# Patient Record
Sex: Female | Born: 1990 | Race: White | Hispanic: No | Marital: Single | Smoking: Never smoker
Health system: Southern US, Community
[De-identification: ages and names within clinical notes are randomized; demographics above are authoritative.]

## PROBLEM LIST (undated history)

## (undated) DIAGNOSIS — K219 Gastro-esophageal reflux disease without esophagitis: Secondary | ICD-10-CM

## (undated) DIAGNOSIS — F329 Major depressive disorder, single episode, unspecified: Secondary | ICD-10-CM

## (undated) DIAGNOSIS — F32A Depression, unspecified: Secondary | ICD-10-CM

## (undated) DIAGNOSIS — C801 Malignant (primary) neoplasm, unspecified: Secondary | ICD-10-CM

## (undated) DIAGNOSIS — E039 Hypothyroidism, unspecified: Secondary | ICD-10-CM

## (undated) HISTORY — PX: PILONIDAL CYST DRAINAGE: SHX743

---

## 2015-05-14 ENCOUNTER — Other Ambulatory Visit (HOSPITAL_BASED_OUTPATIENT_CLINIC_OR_DEPARTMENT_OTHER): Payer: Self-pay | Admitting: Physician Assistant

## 2015-05-14 DIAGNOSIS — E049 Nontoxic goiter, unspecified: Secondary | ICD-10-CM

## 2015-05-16 ENCOUNTER — Ambulatory Visit (HOSPITAL_BASED_OUTPATIENT_CLINIC_OR_DEPARTMENT_OTHER)
Admission: RE | Admit: 2015-05-16 | Discharge: 2015-05-16 | Disposition: A | Discharge status: Home / Self Care | Payer: BLUE CROSS/BLUE SHIELD | Source: Ambulatory Visit | Attending: Physician Assistant | Admitting: Physician Assistant

## 2015-05-16 DIAGNOSIS — E049 Nontoxic goiter, unspecified: Secondary | ICD-10-CM | POA: Diagnosis present

## 2015-05-16 DIAGNOSIS — E042 Nontoxic multinodular goiter: Secondary | ICD-10-CM | POA: Diagnosis not present

## 2015-05-29 ENCOUNTER — Other Ambulatory Visit: Payer: Self-pay | Admitting: Physician Assistant

## 2015-05-29 DIAGNOSIS — E041 Nontoxic single thyroid nodule: Secondary | ICD-10-CM

## 2015-06-05 ENCOUNTER — Other Ambulatory Visit (HOSPITAL_COMMUNITY)
Admission: RE | Admit: 2015-06-05 | Discharge: 2015-06-05 | Disposition: A | Discharge status: Home / Self Care | Payer: BLUE CROSS/BLUE SHIELD | Source: Ambulatory Visit | Attending: Interventional Radiology | Admitting: Interventional Radiology

## 2015-06-05 ENCOUNTER — Ambulatory Visit
Admission: RE | Admit: 2015-06-05 | Discharge: 2015-06-05 | Disposition: A | Discharge status: Home / Self Care | Payer: BLUE CROSS/BLUE SHIELD | Source: Ambulatory Visit | Attending: Physician Assistant | Admitting: Physician Assistant

## 2015-06-05 DIAGNOSIS — E041 Nontoxic single thyroid nodule: Secondary | ICD-10-CM | POA: Insufficient documentation

## 2015-09-16 ENCOUNTER — Ambulatory Visit: Payer: Self-pay | Admitting: General Surgery

## 2015-09-16 DIAGNOSIS — C73 Malignant neoplasm of thyroid gland: Secondary | ICD-10-CM | POA: Diagnosis not present

## 2015-09-16 NOTE — H&P (Signed)
History of Present Illness Ralene Ok MD; 09/16/2015 4:11 PM) The patient is a 24 year old female who presents with a thyroid nodule. Patient is a 25 year old female who is referred by Corine Shelter, PA for evaluation of a thyroid nodule. Patient underwent FNA of the left thyroid nodule which measured 2.6 x 2 x 1.9 cm. Patient has had no dysphagia. This resulted in suspicious for malignancy Bethesda category 5. This appeared suspicious for papillary carcinoma. Patient did have a previous ultrasound which revealed: Right thyroid lobe  Measurements: 5.0 cm x 1.5 cm x 1.7 cm. Heterogeneous thyroid tissue on the right without significant increased flow.  Inferior thyroid nodule measures 1.2 cm x 9 mm x 8 mm. No internal calcifications. Hypoechoic margin with no infiltrative border.  Left thyroid lobe  Measurements: 4.9 cm x 2.0 cm x 2.1 cm. Heterogeneous left-sided thyroid tissue with no increased internal flow. Nodule of the left thyroid measures 2.6 cm x 2.0 cm x 1.9 cm. Hypoechoic margin. Internal colloid or calcium.  Isthmus  Thickness: 5 mm. Isthmic nodule measures 9 mm x 7 mm x 6 mm.     Other Problems Marjean Donna, CMA; 09/16/2015 3:38 PM) Anxiety Disorder Depression  Diagnostic Studies History Marjean Donna, CMA; 09/16/2015 3:38 PM) Colonoscopy never Mammogram never Pap Smear never  Allergies Davy Pique Bynum, CMA; 09/16/2015 3:39 PM) No Known Drug Allergies06/20/2017  Medication History Marjean Donna, CMA; 09/16/2015 3:39 PM) Unisom (25MG  Tablet, Oral) Active. Medications Reconciled  Social History Marjean Donna, CMA; 09/16/2015 3:38 PM) Alcohol use Moderate alcohol use. Caffeine use Coffee. No drug use Tobacco use Never smoker.  Family History Marjean Donna, [place]; 09/16/2015 3:38 PM) Alcohol Abuse Mother. Breast Cancer Family Members In General. Depression Mother. Hypertension Mother.  Pregnancy / Birth History Marjean Donna, [place]; 09/16/2015 3:38  PM) Age at menarche 7 years. Gravida 0 Irregular periods Para 0    Review of Systems ([place]; 09/16/2015 3:38 PM) General Present- Fatigue. Not Present- Appetite Loss, Chills, Fever, Night Sweats, Weight Gain and Weight Loss. Skin Not Present- Change in Wart/Mole, Dryness, Hives, Jaundice, New Lesions, Non-Healing Wounds, Rash and Ulcer. HEENT Present- Visual Disturbances. Not Present- Earache, Hearing Loss, Hoarseness, Nose Bleed, Oral Ulcers, Ringing in the Ears, Seasonal Allergies, Sinus Pain, Sore Throat, Wears glasses/contact lenses and Yellow Eyes. Respiratory Not Present- Bloody sputum, Chronic Cough, Difficulty Breathing, Snoring and Wheezing. Breast Not Present- Breast Mass, Breast Pain, Nipple Discharge and Skin Changes. Cardiovascular Not Present- Chest Pain, Difficulty Breathing Lying Down, Leg Cramps, Palpitations, Rapid Heart Rate, Shortness of Breath and Swelling of Extremities. Gastrointestinal Not Present- Abdominal Pain, Bloating, Bloody Stool, Change in Bowel Habits, Chronic diarrhea, Constipation, Difficulty Swallowing, Excessive gas, Gets full quickly at meals, Hemorrhoids, Indigestion, Nausea, Rectal Pain and Vomiting. Female Genitourinary Not Present- Frequency, Nocturia, Painful Urination, Pelvic Pain and Urgency. Musculoskeletal Not Present- Back Pain, Joint Pain, Joint Stiffness, Muscle Pain, Muscle Weakness and Swelling of Extremities. Neurological Not Present- Decreased Memory, Fainting, Headaches, Numbness, Seizures, Tingling, Tremor, Trouble walking and Weakness. Psychiatric Present- Anxiety and Depression. Not Present- Bipolar, Change in Sleep Pattern, Fearful and Frequent crying. Endocrine Not Present- Cold Intolerance, Excessive Hunger, Hair Changes, Heat Intolerance, Hot flashes and New Diabetes. Hematology Not Present- Blood Thinners, Easy Bruising, Excessive bleeding, Gland problems, HIV and Persistent Infections.  Vitals (Sonya Bynum CMA;  09/16/2015 3:39 PM) 09/16/2015 3:38 PM Weight: 194 lb Height: 64in Body Surface Area: 1.93 m Body Mass Index: 33.3 kg/m  Temp.: 70F(Temporal)  Pulse: 75 (Regular)  BP: 124/80 (Sitting,  Left Arm, Standard)       Physical Exam Ralene Ok MD; 09/16/2015 4:12 PM) General Mental Status-Alert. General Appearance-Consistent with stated age. Hydration-Well hydrated. Voice-Normal.  Head and Neck Note: Bilateral palpable nodules, left greater than right   Cardiovascular Cardiovascular examination reveals -normal heart sounds, regular rate and rhythm with no murmurs and normal pedal pulses bilaterally.  Abdomen Inspection Inspection of the abdomen reveals - No Hernias. Skin - Scar - no surgical scars. Palpation/Percussion Palpation and Percussion of the abdomen reveal - Soft, Non Tender, No Rebound tenderness, No Rigidity (guarding) and No hepatosplenomegaly. Auscultation Auscultation of the abdomen reveals - Bowel sounds normal.    Assessment & Plan Ralene Ok MD; 09/16/2015 4:13 PM) PAPILLARY CARCINOMA OF THYROID (C73) Impression: Patient is a 25 year old female with a possible papillary carcinoma on FNA biopsy of the thyroid. Patient does have bilateral nodules. At this time I recommended to her to proceed with total thyroidectomy. I discussed with her the pathology of the biopsy as well as the procedure in detail.  1. Will proceed to the operative for a total thyroidectomy 2. All risks and benefits were discussed with the patient to generally include: infection, bleeding, damage to the recurrent laryngeal nerve, damage to parathyroid glands, and possible need for further surgery. Alternatives were offered and described. All questions were answered and the patient voiced understanding of the procedure and wishes to proceed.

## 2015-09-20 NOTE — Pre-Procedure Instructions (Signed)
Cynthia [name]  09/20/2015     No Pharmacies Listed   Your procedure is scheduled on Wednesday, June 28th   Report to [place] at [facility].             (posted surgery time 2:00 pm - 4:29 pm)   Call this number if you have problems the morning of surgery:  [phone]   Remember:  Do not eat food or drink liquids after midnight Tuesday.  Take these medicines the morning of surgery with A SIP OF WATER :   Do not wear jewelry, make-up or nail polish.  Do not wear lotions, powders, or perfumes.     Do not shave underarms & legs 48 hours prior to surgery.    Do not bring valuables to the hospital.  [facility] is not responsible for any belongings or valuables.  Contacts, dentures or bridgework may not be worn into surgery.  Leave your suitcase in the car.  After surgery it may be brought to your room. For patients admitted to the hospital, discharge time will be determined by your treatment team.   Name and phone number of your driver:      Please read over the following fact sheets that you were given. Pain Booklet and Surgical Site Infection Prevention

## 2015-09-22 ENCOUNTER — Encounter (HOSPITAL_COMMUNITY): Payer: Self-pay

## 2015-09-22 ENCOUNTER — Encounter (HOSPITAL_COMMUNITY)
Admission: RE | Admit: 2015-09-22 | Discharge: 2015-09-22 | Disposition: A | Discharge status: Home / Self Care | Payer: BLUE CROSS/BLUE SHIELD | Source: Ambulatory Visit | Attending: General Surgery | Admitting: General Surgery

## 2015-09-22 DIAGNOSIS — F329 Major depressive disorder, single episode, unspecified: Secondary | ICD-10-CM | POA: Diagnosis not present

## 2015-09-22 DIAGNOSIS — E039 Hypothyroidism, unspecified: Secondary | ICD-10-CM | POA: Diagnosis not present

## 2015-09-22 DIAGNOSIS — F419 Anxiety disorder, unspecified: Secondary | ICD-10-CM | POA: Diagnosis not present

## 2015-09-22 DIAGNOSIS — C73 Malignant neoplasm of thyroid gland: Secondary | ICD-10-CM | POA: Diagnosis not present

## 2015-09-22 DIAGNOSIS — E042 Nontoxic multinodular goiter: Secondary | ICD-10-CM | POA: Diagnosis present

## 2015-09-22 DIAGNOSIS — E063 Autoimmune thyroiditis: Secondary | ICD-10-CM | POA: Diagnosis not present

## 2015-09-22 HISTORY — DX: Major depressive disorder, single episode, unspecified: F32.9

## 2015-09-22 HISTORY — DX: Hypothyroidism, unspecified: E03.9

## 2015-09-22 HISTORY — DX: Malignant (primary) neoplasm, unspecified: C80.1

## 2015-09-22 HISTORY — DX: Gastro-esophageal reflux disease without esophagitis: K21.9

## 2015-09-22 HISTORY — DX: Depression, unspecified: F32.A

## 2015-09-22 LAB — CBC
HCT: 39 % (ref 36.0–46.0)
HEMOGLOBIN: 12.3 g/dL (ref 12.0–15.0)
MCH: 23.7 pg — AB (ref 26.0–34.0)
MCHC: 31.5 g/dL (ref 30.0–36.0)
MCV: 75.1 fL — ABNORMAL LOW (ref 78.0–100.0)
PLATELETS: 164 10*3/uL (ref 150–400)
RBC: 5.19 MIL/uL — AB (ref 3.87–5.11)
RDW: 14.7 % (ref 11.5–15.5)
WBC: 5.5 10*3/uL (ref 4.0–10.5)

## 2015-09-22 LAB — HCG, SERUM, QUALITATIVE: PREG SERUM: NEGATIVE

## 2015-09-22 NOTE — [facility] Notes (Addendum)
PCP  Is Dr. Corine Shelter at [place]  03/2015 Denies  Heart problems, no racing.

## 2015-09-23 MED ORDER — CEFAZOLIN SODIUM-DEXTROSE 2-4 GM/100ML-% IV SOLN: 2.0000 g | INTRAVENOUS | Status: DC

## 2015-09-24 ENCOUNTER — Ambulatory Visit (HOSPITAL_COMMUNITY): Payer: BLUE CROSS/BLUE SHIELD

## 2015-09-24 ENCOUNTER — Encounter (HOSPITAL_COMMUNITY)
Admission: RE | Disposition: A | Discharge status: Home / Self Care | Payer: Self-pay | Source: Ambulatory Visit | Attending: General Surgery

## 2015-09-24 ENCOUNTER — Encounter (HOSPITAL_COMMUNITY): Payer: Self-pay | Admitting: *Deleted

## 2015-09-24 ENCOUNTER — Observation Stay (HOSPITAL_COMMUNITY)
Admission: RE | Admit: 2015-09-24 | Discharge: 2015-09-25 | Disposition: A | Discharge status: Home / Self Care | Payer: BLUE CROSS/BLUE SHIELD | Source: Ambulatory Visit | Attending: General Surgery | Admitting: General Surgery

## 2015-09-24 ENCOUNTER — Ambulatory Visit (HOSPITAL_COMMUNITY): Payer: BLUE CROSS/BLUE SHIELD | Admitting: Certified Registered"

## 2015-09-24 DIAGNOSIS — Z01818 Encounter for other preprocedural examination: Secondary | ICD-10-CM

## 2015-09-24 DIAGNOSIS — Z9889 Other specified postprocedural states: Secondary | ICD-10-CM

## 2015-09-24 DIAGNOSIS — E063 Autoimmune thyroiditis: Secondary | ICD-10-CM | POA: Insufficient documentation

## 2015-09-24 DIAGNOSIS — F419 Anxiety disorder, unspecified: Secondary | ICD-10-CM | POA: Diagnosis not present

## 2015-09-24 DIAGNOSIS — C73 Malignant neoplasm of thyroid gland: Principal | ICD-10-CM | POA: Insufficient documentation

## 2015-09-24 DIAGNOSIS — E890 Postprocedural hypothyroidism: Secondary | ICD-10-CM

## 2015-09-24 DIAGNOSIS — F329 Major depressive disorder, single episode, unspecified: Secondary | ICD-10-CM | POA: Diagnosis not present

## 2015-09-24 DIAGNOSIS — E039 Hypothyroidism, unspecified: Secondary | ICD-10-CM | POA: Insufficient documentation

## 2015-09-24 HISTORY — PX: THYROIDECTOMY: SHX17

## 2015-09-24 SURGERY — THYROIDECTOMY
Anesthesia: General | Site: Neck

## 2015-09-24 MED ORDER — SUGAMMADEX SODIUM 200 MG/2ML IV SOLN
INTRAVENOUS | Status: AC
  Filled 2015-09-24: qty 2

## 2015-09-24 MED ORDER — DEXAMETHASONE SODIUM PHOSPHATE 10 MG/ML IJ SOLN
INTRAMUSCULAR | Status: DC | PRN
  Administered 2015-09-24: 10 mg via INTRAVENOUS

## 2015-09-24 MED ORDER — DOXYLAMINE SUCCINATE (SLEEP) 25 MG PO TABS: 25.0000 mg | ORAL_TABLET | Freq: Every evening | ORAL | Status: DC | PRN

## 2015-09-24 MED ORDER — HYDROMORPHONE HCL 1 MG/ML IJ SOLN
INTRAMUSCULAR | Status: AC
  Filled 2015-09-24: qty 1

## 2015-09-24 MED ORDER — LIDOCAINE 2% (20 MG/ML) 5 ML SYRINGE
INTRAMUSCULAR | Status: AC
  Filled 2015-09-24: qty 5

## 2015-09-24 MED ORDER — PHENYLEPHRINE HCL 10 MG/ML IJ SOLN
INTRAMUSCULAR | Status: DC | PRN
  Administered 2015-09-24: 80 ug via INTRAVENOUS
  Administered 2015-09-24: 40 ug via INTRAVENOUS
  Administered 2015-09-24: 80 ug via INTRAVENOUS

## 2015-09-24 MED ORDER — ONDANSETRON HCL 4 MG/2ML IJ SOLN
INTRAMUSCULAR | Status: AC
  Filled 2015-09-24: qty 2

## 2015-09-24 MED ORDER — MIDAZOLAM HCL 5 MG/5ML IJ SOLN
INTRAMUSCULAR | Status: DC | PRN
  Administered 2015-09-24: 2 mg via INTRAVENOUS

## 2015-09-24 MED ORDER — CHLORHEXIDINE GLUCONATE CLOTH 2 % EX PADS: 6.0000 | MEDICATED_PAD | Freq: Once | CUTANEOUS | Status: DC

## 2015-09-24 MED ORDER — HYDROMORPHONE HCL 1 MG/ML IJ SOLN
0.5000 mg | INTRAMUSCULAR | Status: DC | PRN
  Administered 2015-09-25: 0.5 mg via INTRAVENOUS
  Filled 2015-09-24: qty 1

## 2015-09-24 MED ORDER — FENTANYL CITRATE (PF) 100 MCG/2ML IJ SOLN
INTRAMUSCULAR | Status: DC | PRN
  Administered 2015-09-24: 50 ug via INTRAVENOUS
  Administered 2015-09-24: 100 ug via INTRAVENOUS
  Administered 2015-09-24 (×2): 50 ug via INTRAVENOUS

## 2015-09-24 MED ORDER — ONDANSETRON HCL 4 MG/2ML IJ SOLN
INTRAMUSCULAR | Status: DC | PRN
  Administered 2015-09-24: 4 mg via INTRAVENOUS

## 2015-09-24 MED ORDER — DEXTROSE-NACL 5-0.9 % IV SOLN
INTRAVENOUS | Status: DC
  Administered 2015-09-24 – 2015-09-25 (×2): via INTRAVENOUS

## 2015-09-24 MED ORDER — HYDROMORPHONE HCL 1 MG/ML IJ SOLN
0.5000 mg | INTRAMUSCULAR | Status: DC | PRN
  Administered 2015-09-24 (×2): 0.5 mg via INTRAVENOUS

## 2015-09-24 MED ORDER — CEFAZOLIN SODIUM-DEXTROSE 2-4 GM/100ML-% IV SOLN
INTRAVENOUS | Status: AC
  Administered 2015-09-24: 2 g via INTRAVENOUS
  Filled 2015-09-24: qty 100

## 2015-09-24 MED ORDER — BUPIVACAINE HCL (PF) 0.25 % IJ SOLN
INTRAMUSCULAR | Status: AC
  Filled 2015-09-24: qty 30

## 2015-09-24 MED ORDER — ONDANSETRON 4 MG PO TBDP: 4.0000 mg | ORAL_TABLET | Freq: Four times a day (QID) | ORAL | Status: DC | PRN

## 2015-09-24 MED ORDER — 0.9 % SODIUM CHLORIDE (POUR BTL) OPTIME
TOPICAL | Status: DC | PRN
  Administered 2015-09-24: 1000 mL

## 2015-09-24 MED ORDER — FENTANYL CITRATE (PF) 250 MCG/5ML IJ SOLN
INTRAMUSCULAR | Status: AC
  Filled 2015-09-24: qty 5

## 2015-09-24 MED ORDER — ROCURONIUM BROMIDE 100 MG/10ML IV SOLN
INTRAVENOUS | Status: DC | PRN
  Administered 2015-09-24: 50 mg via INTRAVENOUS

## 2015-09-24 MED ORDER — OXYCODONE HCL 5 MG PO TABS
5.0000 mg | ORAL_TABLET | ORAL | Status: DC | PRN
  Administered 2015-09-24 – 2015-09-25 (×3): 10 mg via ORAL
  Filled 2015-09-24 (×3): qty 2

## 2015-09-24 MED ORDER — MIDAZOLAM HCL 2 MG/2ML IJ SOLN
INTRAMUSCULAR | Status: AC
  Filled 2015-09-24: qty 2

## 2015-09-24 MED ORDER — PHENOL 1.4 % MT LIQD
1.0000 | OROMUCOSAL | Status: DC | PRN
  Administered 2015-09-25: 1 via OROMUCOSAL
  Filled 2015-09-24: qty 177

## 2015-09-24 MED ORDER — PROPOFOL 10 MG/ML IV BOLUS
INTRAVENOUS | Status: DC | PRN
  Administered 2015-09-24: 200 mg via INTRAVENOUS

## 2015-09-24 MED ORDER — PHENYLEPHRINE 40 MCG/ML (10ML) SYRINGE FOR IV PUSH (FOR BLOOD PRESSURE SUPPORT)
PREFILLED_SYRINGE | INTRAVENOUS | Status: AC
  Filled 2015-09-24: qty 10

## 2015-09-24 MED ORDER — ONDANSETRON HCL 4 MG/2ML IJ SOLN: 4.0000 mg | Freq: Four times a day (QID) | INTRAMUSCULAR | Status: DC | PRN

## 2015-09-24 MED ORDER — BUPIVACAINE HCL 0.25 % IJ SOLN
INTRAMUSCULAR | Status: DC | PRN
  Administered 2015-09-24: 10 mL

## 2015-09-24 MED ORDER — SUGAMMADEX SODIUM 500 MG/5ML IV SOLN
INTRAVENOUS | Status: DC | PRN
  Administered 2015-09-24: 200 mg via INTRAVENOUS

## 2015-09-24 MED ORDER — PROPOFOL 10 MG/ML IV BOLUS
INTRAVENOUS | Status: AC
  Filled 2015-09-24: qty 20

## 2015-09-24 MED ORDER — LIDOCAINE HCL (CARDIAC) 20 MG/ML IV SOLN
INTRAVENOUS | Status: DC | PRN
  Administered 2015-09-24: 100 mg via INTRAVENOUS

## 2015-09-24 MED ORDER — LACTATED RINGERS IV SOLN
INTRAVENOUS | Status: DC
  Administered 2015-09-24 (×2): via INTRAVENOUS

## 2015-09-24 MED ORDER — ONDANSETRON HCL 4 MG/2ML IJ SOLN: 4.0000 mg | Freq: Once | INTRAMUSCULAR | Status: DC | PRN

## 2015-09-24 MED ORDER — ROCURONIUM BROMIDE 50 MG/5ML IV SOLN
INTRAVENOUS | Status: AC
  Filled 2015-09-24: qty 1

## 2015-09-24 SURGICAL SUPPLY — 47 items
BLADE SURG 15 STRL LF DISP TIS (BLADE) ×1 IMPLANT
BLADE SURG 15 STRL SS (BLADE) ×1
BLADE SURG ROTATE 9660 (MISCELLANEOUS) IMPLANT
CANISTER SUCTION 2500CC (MISCELLANEOUS) ×2 IMPLANT
CLIP TI MEDIUM 24 (CLIP) ×2 IMPLANT
CLIP TI WIDE RED SMALL 24 (CLIP) ×2 IMPLANT
CONT SPEC 4OZ CLIKSEAL STRL BL (MISCELLANEOUS) IMPLANT
COVER SURGICAL LIGHT HANDLE (MISCELLANEOUS) ×2 IMPLANT
CRADLE DONUT ADULT HEAD (MISCELLANEOUS) ×2 IMPLANT
DERMABOND ADVANCED (GAUZE/BANDAGES/DRESSINGS) ×1
DERMABOND ADVANCED .7 DNX12 (GAUZE/BANDAGES/DRESSINGS) ×1 IMPLANT
DRAPE LAPAROTOMY T 98X78 PEDS (DRAPES) ×2 IMPLANT
DRAPE UTILITY XL STRL (DRAPES) ×4 IMPLANT
ELECT COATED BLADE 2.86 ST (ELECTRODE) ×2 IMPLANT
ELECT REM PT RETURN 9FT ADLT (ELECTROSURGICAL) ×2
ELECTRODE REM PT RTRN 9FT ADLT (ELECTROSURGICAL) ×1 IMPLANT
GAUZE SPONGE 4X4 12PLY STRL (GAUZE/BANDAGES/DRESSINGS) ×2 IMPLANT
GAUZE SPONGE 4X4 16PLY XRAY LF (GAUZE/BANDAGES/DRESSINGS) ×4 IMPLANT
GLOVE BIO SURGEON STRL SZ7.5 (GLOVE) ×2 IMPLANT
GOWN STRL REUS W/ TWL LRG LVL3 (GOWN DISPOSABLE) ×1 IMPLANT
GOWN STRL REUS W/ TWL XL LVL3 (GOWN DISPOSABLE) ×1 IMPLANT
GOWN STRL REUS W/TWL LRG LVL3 (GOWN DISPOSABLE) ×1
GOWN STRL REUS W/TWL XL LVL3 (GOWN DISPOSABLE) ×1
HEMOSTAT SURGICEL 2X4 FIBR (HEMOSTASIS) ×2 IMPLANT
KIT BASIN OR (CUSTOM PROCEDURE TRAY) ×2 IMPLANT
KIT ROOM TURNOVER OR (KITS) ×2 IMPLANT
LIQUID BAND (GAUZE/BANDAGES/DRESSINGS) ×2 IMPLANT
NEEDLE HYPO 25GX1X1/2 BEV (NEEDLE) ×2 IMPLANT
NS IRRIG 1000ML POUR BTL (IV SOLUTION) ×2 IMPLANT
PACK SURGICAL SETUP 50X90 (CUSTOM PROCEDURE TRAY) ×2 IMPLANT
PAD ARMBOARD 7.5X6 YLW CONV (MISCELLANEOUS) ×2 IMPLANT
PENCIL BUTTON HOLSTER BLD 10FT (ELECTRODE) ×2 IMPLANT
SHEARS HARMONIC 9CM CVD (BLADE) ×2 IMPLANT
SPECIMEN JAR MEDIUM (MISCELLANEOUS) ×2 IMPLANT
SPONGE INTESTINAL PEANUT (DISPOSABLE) ×4 IMPLANT
SUT MNCRL AB 4-0 PS2 18 (SUTURE) ×2 IMPLANT
SUT SILK 2 0 (SUTURE) ×1
SUT SILK 2 0 SH (SUTURE) ×2 IMPLANT
SUT SILK 2-0 18XBRD TIE 12 (SUTURE) ×1 IMPLANT
SUT SILK 3 0 (SUTURE) ×1
SUT SILK 3-0 18XBRD TIE 12 (SUTURE) ×1 IMPLANT
SUT VIC AB 3-0 SH 18 (SUTURE) ×4 IMPLANT
SYR BULB 3OZ (MISCELLANEOUS) ×2 IMPLANT
SYR CONTROL 10ML LL (SYRINGE) ×2 IMPLANT
TOWEL OR 17X24 6PK STRL BLUE (TOWEL DISPOSABLE) ×2 IMPLANT
TOWEL OR 17X26 10 PK STRL BLUE (TOWEL DISPOSABLE) ×2 IMPLANT
TUBE CONNECTING 12X1/4 (SUCTIONS) ×2 IMPLANT

## 2015-09-24 NOTE — Op Note (Signed)
09/24/2015  3:48 PM  PATIENT:  Cynthia [name]  25 y.o. female  PRE-OPERATIVE DIAGNOSIS:  multiple thyroid nodule, FNAB c/w papillary cancer  POST-OPERATIVE DIAGNOSIS:  same  PROCEDURE:  Procedure(s): TOTAL THYROIDECTOMY (N/A)  SURGEON:  Surgeon(s) and Role:    * Ralene Ok, MD - Primary    * Fanny Skates, MD - Assisting  ANESTHESIA:   local and general  EBL: 15cc  Total I/O In: 1000 [I.V.:1000] Out: -   BLOOD ADMINISTERED:none  DRAINS: none   LOCAL MEDICATIONS USED:  BUPIVICAINE   SPECIMEN:  Source of Specimen:  Total thyroid, stitch in right superior lobe  DISPOSITION OF SPECIMEN:  PATHOLOGY  COUNTS:  YES  TOURNIQUET:  * No tourniquets in log *  DICTATION: .Dragon Dictation  Complications: none   Counts: reported as correct x 2   Findings: fibrotic left thyroid lobe  Indications for procedure: The patient is a 25 y/o F who had a multiple thyroid nodules and FNAB Bethesda V category, possible papillary cancer.    Details of the procedure:  The patient was taken back to the operating room. The patient was placed in supine position with bilateral SCDs in place.  The patient was prepped and draped in the usual sterile fashion. After appropriate anitbiotics were confirmed, a time-out was confirmed and all facts were verified.   A 5 cm incision was made approximately 2 fingerbreadths above the sternal notch. Bovie cautery was used to maintain hemostasis dissection was carried down through the platysma. The platysma was elevated and flaps were created superiorly and inferiorly to the thyroid cartilage as well as the sternal notch, repsectively. The strap muscles were identified in the midline and separated. Left-sided strap muscles were elevated off the anterior surface of the thyroid. This dissection was carried laterally. The middle thyroid vein was identified and doubly ligated. We proceeded to dissect away the superior lobe and Kitners were used to gently  dissect the surrounding musculature from the thyroid. The superior thyroid vessels were identified and doubly ligated with clips and transected with a Harmonic scalpel. At this time this freed up the superior lobe was able to deliver this into the wound. We also identified the superior parathyroid gland which we preserved.   We continued to dissect the thyroid off of the trachea from lateral to medial direction. The Left recurrent laryngeal nerve was identified and protected.  There was a portion of the thyroid gland that was very firm and overlying the nerve.  A portion of the thyoid tissue was cut with #15 blade and left in place.  This was the are that was the most fibrotic and firm.  The inferior thyroid vessels were identified ligated with clips. At this rtime Berry's ligament was dissected away from the trachea. This delivered the left lobe of the thyroid into the wound.    The exact same dissection took place on the right side.  The superior vessels were identified on the right side these were doubly ligated and transected with a harmonic scalpel. This delivered the superior lobe into the wound. The middle and inferior thyroid vessels were also individually doubly ligated and transected using a harmonic scalpel.  This allowed [place] to roll the right thyroid lobe into the wound. The right recurrent laryngeal nerve was not identified our dissection took place on the capsule of the thyroid. The right side of the thyroid was less fibrotic of the left. The dissection was carried medially until basal ligament was encountered. This was  Incised  using cautery. At this time this freed up the entire thyroid. A stitch was placed in the left superior lobe. This was sent off to pathology.   At this time The area was irrigated out with sterile saline.There was an area of the raw portion of the left thyroid tissue that was Left near the left recurrent laryngeal Nerve. With fibrillar and pressure the area became  hemostatic.At this time further sheets of fibrillar were then placed in the left and right peritracheal sides.  Strap muscles were then reapproximated in the midline with interrupted 3-0 Vicryl stitches. The platysma was reapproximated using 3-0 Vicryl stitches in interrupted fashion. Skin was then reapproximated using a running subcuticular 4-0 Monocryl. The skin was then dressed with Dermabond. The patient was taken to the recovery room in stable condition.   PLAN OF CARE: Admit for overnight observation  PATIENT DISPOSITION:  PACU - hemodynamically stable.   Delay start of Pharmacological VTE agent (>24hrs) due to surgical blood loss or risk of bleeding: not applicable

## 2015-09-24 NOTE — Anesthesia Preprocedure Evaluation (Signed)
Anesthesia Evaluation  Patient identified by MRN, date of birth, ID band Patient awake    Reviewed: Allergy & Precautions, NPO status , reviewed documented beta blocker date and time   Airway Mallampati: I       Dental   Pulmonary    Pulmonary exam normal        Cardiovascular Normal cardiovascular exam     Neuro/Psych    GI/Hepatic   Endo/Other  Hypothyroidism   Renal/GU      Musculoskeletal   Abdominal   Peds  Hematology   Anesthesia Other Findings   Reproductive/Obstetrics                             Anesthesia Physical Anesthesia Plan  ASA: I  Anesthesia Plan: General   Post-op Pain Management:    Induction: Intravenous  Airway Management Planned: Oral ETT  Additional Equipment:   Intra-op Plan:   Post-operative Plan: Extubation in OR  Informed Consent: I have reviewed the patients History and Physical, chart, labs and discussed the procedure including the risks, benefits and alternatives for the proposed anesthesia with the patient or authorized representative who has indicated his/her understanding and acceptance.     Plan Discussed with: CRNA, Anesthesiologist and Surgeon  Anesthesia Plan Comments:         Anesthesia Quick Evaluation

## 2015-09-24 NOTE — Transfer of Care (Signed)
Immediate Anesthesia Transfer of Care Note  Patient: Cynthia [name]  Procedure(s) Performed: Procedure(s): TOTAL THYROIDECTOMY (N/A)  Patient Location: PACU  Anesthesia Type:General  Level of Consciousness: awake, alert , oriented and patient cooperative  Airway & Oxygen Therapy: Patient Spontanous Breathing and Patient connected to nasal cannula oxygen  Post-op Assessment: Report given to RN, Post -op Vital signs reviewed and stable and Patient moving all extremities  Post vital signs: Reviewed and stable  Last Vitals:  Filed Vitals:   09/24/15 1219  BP: 137/83  Pulse: 85  Temp: 36.5 C  Resp: 20    Last Pain: There were no vitals filed for this visit.    Patients Stated Pain Goal: 3 ([id number])  Complications: No apparent anesthesia complications

## 2015-09-24 NOTE — H&P (View-Only) (Signed)
History of Present Illness Cynthia Ok MD; 09/16/2015 4:11 PM) The patient is a 25 year old female who presents with a thyroid nodule. Patient is a 25 year old female who is referred by Corine Shelter, PA for evaluation of a thyroid nodule. Patient underwent FNA of the left thyroid nodule which measured 2.6 x 2 x 1.9 cm. Patient has had no dysphagia. This resulted in suspicious for malignancy Bethesda category 5. This appeared suspicious for papillary carcinoma. Patient did have a previous ultrasound which revealed: Right thyroid lobe  Measurements: 5.0 cm x 1.5 cm x 1.7 cm. Heterogeneous thyroid tissue on the right without significant increased flow.  Inferior thyroid nodule measures 1.2 cm x 9 mm x 8 mm. No internal calcifications. Hypoechoic margin with no infiltrative border.  Left thyroid lobe  Measurements: 4.9 cm x 2.0 cm x 2.1 cm. Heterogeneous left-sided thyroid tissue with no increased internal flow. Nodule of the left thyroid measures 2.6 cm x 2.0 cm x 1.9 cm. Hypoechoic margin. Internal colloid or calcium.  Isthmus  Thickness: 5 mm. Isthmic nodule measures 9 mm x 7 mm x 6 mm.     Other Problems Marjean Donna, CMA; 09/16/2015 3:38 PM) Anxiety Disorder Depression  Diagnostic Studies History Marjean Donna, CMA; 09/16/2015 3:38 PM) Colonoscopy never Mammogram never Pap Smear never  Allergies Davy Pique Bynum, CMA; 09/16/2015 3:39 PM) No Known Drug Allergies06/20/2017  Medication History Davy Pique Bynum, CMA; 09/16/2015 3:39 PM) Unisom (25MG  Tablet, Oral) Active. Medications Reconciled  Social History Marjean Donna, CMA; 09/16/2015 3:38 PM) Alcohol use Moderate alcohol use. Caffeine use Coffee. No drug use Tobacco use Never smoker.  Family History Marjean Donna, [place]; 09/16/2015 3:38 PM) Alcohol Abuse Mother. Breast Cancer Family Members In General. Depression Mother. Hypertension Mother.  Pregnancy / Birth History Marjean Donna, [place]; 09/16/2015 3:38  PM) Age at menarche 51 years. Gravida 0 Irregular periods Para 0    Review of Systems ([place]; 09/16/2015 3:38 PM) General Present- Fatigue. Not Present- Appetite Loss, Chills, Fever, Night Sweats, Weight Gain and Weight Loss. Skin Not Present- Change in Wart/Mole, Dryness, Hives, Jaundice, New Lesions, Non-Healing Wounds, Rash and Ulcer. HEENT Present- Visual Disturbances. Not Present- Earache, Hearing Loss, Hoarseness, Nose Bleed, Oral Ulcers, Ringing in the Ears, Seasonal Allergies, Sinus Pain, Sore Throat, Wears glasses/contact lenses and Yellow Eyes. Respiratory Not Present- Bloody sputum, Chronic Cough, Difficulty Breathing, Snoring and Wheezing. Breast Not Present- Breast Mass, Breast Pain, Nipple Discharge and Skin Changes. Cardiovascular Not Present- Chest Pain, Difficulty Breathing Lying Down, Leg Cramps, Palpitations, Rapid Heart Rate, Shortness of Breath and Swelling of Extremities. Gastrointestinal Not Present- Abdominal Pain, Bloating, Bloody Stool, Change in Bowel Habits, Chronic diarrhea, Constipation, Difficulty Swallowing, Excessive gas, Gets full quickly at meals, Hemorrhoids, Indigestion, Nausea, Rectal Pain and Vomiting. Female Genitourinary Not Present- Frequency, Nocturia, Painful Urination, Pelvic Pain and Urgency. Musculoskeletal Not Present- Back Pain, Joint Pain, Joint Stiffness, Muscle Pain, Muscle Weakness and Swelling of Extremities. Neurological Not Present- Decreased Memory, Fainting, Headaches, Numbness, Seizures, Tingling, Tremor, Trouble walking and Weakness. Psychiatric Present- Anxiety and Depression. Not Present- Bipolar, Change in Sleep Pattern, Fearful and Frequent crying. Endocrine Not Present- Cold Intolerance, Excessive Hunger, Hair Changes, Heat Intolerance, Hot flashes and New Diabetes. Hematology Not Present- Blood Thinners, Easy Bruising, Excessive bleeding, Gland problems, HIV and Persistent Infections.  Vitals (Sonya Bynum CMA;  09/16/2015 3:39 PM) 09/16/2015 3:38 PM Weight: 194 lb Height: 64in Body Surface Area: 1.93 m Body Mass Index: 33.3 kg/m  Temp.: 41F(Temporal)  Pulse: 75 (Regular)  BP: 124/80 (Sitting,  Left Arm, Standard)       Physical Exam Cynthia Ok MD; 09/16/2015 4:12 PM) General Mental Status-Alert. General Appearance-Consistent with stated age. Hydration-Well hydrated. Voice-Normal.  Head and Neck Note: Bilateral palpable nodules, left greater than right   Cardiovascular Cardiovascular examination reveals -normal heart sounds, regular rate and rhythm with no murmurs and normal pedal pulses bilaterally.  Abdomen Inspection Inspection of the abdomen reveals - No Hernias. Skin - Scar - no surgical scars. Palpation/Percussion Palpation and Percussion of the abdomen reveal - Soft, Non Tender, No Rebound tenderness, No Rigidity (guarding) and No hepatosplenomegaly. Auscultation Auscultation of the abdomen reveals - Bowel sounds normal.    Assessment & Plan Cynthia Ok MD; 09/16/2015 4:13 PM) PAPILLARY CARCINOMA OF THYROID (C73) Impression: Patient is a 25 year old female with a possible papillary carcinoma on FNA biopsy of the thyroid. Patient does have bilateral nodules. At this time I recommended to her to proceed with total thyroidectomy. I discussed with her the pathology of the biopsy as well as the procedure in detail.  1. Will proceed to the operative for a total thyroidectomy 2. All risks and benefits were discussed with the patient to generally include: infection, bleeding, damage to the recurrent laryngeal nerve, damage to parathyroid glands, and possible need for further surgery. Alternatives were offered and described. All questions were answered and the patient voiced understanding of the procedure and wishes to proceed.

## 2015-09-24 NOTE — Discharge Instructions (Signed)
Thyroidectomy, Care After °Refer to this sheet in the next few weeks. These instructions provide you with information about caring for yourself after your procedure. Your health care provider may also give you more specific instructions. Your treatment has been planned according to current medical practices, but problems sometimes occur. Call your health care provider if you have any problems or questions after your procedure. °WHAT TO EXPECT AFTER THE PROCEDURE °After your procedure, it is typical to have: °· Mild pain in the neck or upper body, especially when swallowing. °· A sore throat. °· A weak voice. °HOME CARE INSTRUCTIONS  °· Take medicines only as directed by your health care provider. °· If your entire thyroid gland was removed, you may need to take thyroid hormone medicine from now on. °· Do not take medicines that contain aspirin and ibuprofen until your health care provider says that you can. These medicines can increase your risk of bleeding. °· Some pain medicines cause constipation. Drink enough fluid to keep your urine clear or pale yellow. This can help to prevent constipation. °· Start slowly with eating. You may need to have only liquids and soft foods for a few days or as directed by your health care provider. °· Do not take baths, swim, or use a hot tub until your health care provider approves. °· There are many different ways to close and cover an incision, including stitches (sutures), skin glue, and adhesive strips. Follow your health care provider's instructions for: °¨ Incision care. °¨ Bandage (dressing) changes and removal. °¨ Incision closure removal. °· Resume your usual activities as directed by your health care provider. °· For the first 10 days after the procedure or as instructed by your health care provider: °¨ Do not lift anything heavier than 20 lb (9.1 kg). °¨ Do not jog, swim, or do other strenuous exercises. °¨ Do not play contact sports. °· Keep all follow-up visits as  directed by your health care provider. This is important. °SEEK MEDICAL CARE IF: °· The soreness in your throat gets worse. °· You have increased pain at your incision or incisions. °· You have increased bleeding from an incision. °· Your incision becomes infected. Watch for: °¨ Swelling. °¨ Redness. °¨ Warmth. °¨ Pus. °· You notice a bad smell coming from an incision or dressing. °· You have a fever. °· You feel lightheaded or faint. °· You have numbness, tingling, or muscle spasms in your: °¨ Arms. °¨ Hands. °¨ Feet. °¨ Face. °· You have trouble swallowing. °SEEK IMMEDIATE MEDICAL CARE IF:  °· You develop a rash. °· You have difficulty breathing. °· You hear whistling noises coming from your chest. °· You develop a cough that gets worse. °· Your speech changes, or you have hoarseness that gets worse. °  °This information is not intended to replace advice given to you by your health care provider. Make sure you discuss any questions you have with your health care provider. °  °Document Released: 10/02/2004 Document Revised: 04/05/2014 Document Reviewed: 08/15/2013 °Elsevier Interactive Patient Education ©2016 [employer] ° °

## 2015-09-24 NOTE — Anesthesia Procedure Notes (Addendum)
Procedure Name: Intubation Date/Time: 09/24/2015 2:29 PM Performed by: Freddie Breech Pre-anesthesia Checklist: Patient identified, Emergency Drugs available, Suction available, Patient being monitored and Timeout performed Patient Re-evaluated:Patient Re-evaluated prior to inductionOxygen Delivery Method: Circle system utilized Preoxygenation: Pre-oxygenation with 100% oxygen Intubation Type: IV induction Ventilation: Mask ventilation without difficulty and Oral airway inserted - appropriate to patient size Laryngoscope Size: Mac and 3 Grade View: Grade I Tube size: 7.0 mm Number of attempts: 1 Airway Equipment and Method: Patient positioned with wedge pillow and Stylet Placement Confirmation: ETT inserted through vocal cords under direct vision,  positive ETCO2,  CO2 detector and breath sounds checked- equal and bilateral Secured at: 22 cm Tube secured with: Tape Dental Injury: Teeth and Oropharynx as per pre-operative assessment

## 2015-09-24 NOTE — Interval H&P Note (Signed)
History and Physical Interval Note:  09/24/2015 10:45 AM  Cynthia [name]  has presented today for surgery, with the diagnosis of papillary cancer of thyroid  The various methods of treatment have been discussed with the patient and family. After consideration of risks, benefits and other options for treatment, the patient has consented to  Procedure(s): TOTAL THYROIDECTOMY (N/A) as a surgical intervention .  The patient's history has been reviewed, patient examined, no change in status, stable for surgery.  I have reviewed the patient's chart and labs.  Questions were answered to the patient's satisfaction.     Rosario Jacks., Anne Hahn

## 2015-09-25 ENCOUNTER — Encounter (HOSPITAL_COMMUNITY): Payer: Self-pay | Admitting: General Surgery

## 2015-09-25 DIAGNOSIS — E063 Autoimmune thyroiditis: Secondary | ICD-10-CM | POA: Diagnosis not present

## 2015-09-25 DIAGNOSIS — F419 Anxiety disorder, unspecified: Secondary | ICD-10-CM | POA: Diagnosis not present

## 2015-09-25 DIAGNOSIS — C73 Malignant neoplasm of thyroid gland: Secondary | ICD-10-CM | POA: Diagnosis not present

## 2015-09-25 DIAGNOSIS — F329 Major depressive disorder, single episode, unspecified: Secondary | ICD-10-CM | POA: Diagnosis not present

## 2015-09-25 LAB — COMPREHENSIVE METABOLIC PANEL
ALT: 189 U/L — AB (ref 14–54)
ANION GAP: 10 (ref 5–15)
AST: 96 U/L — ABNORMAL HIGH (ref 15–41)
Albumin: 3.5 g/dL (ref 3.5–5.0)
Alkaline Phosphatase: 70 U/L (ref 38–126)
BUN: 10 mg/dL (ref 6–20)
CHLORIDE: 98 mmol/L — AB (ref 101–111)
CO2: 25 mmol/L (ref 22–32)
CREATININE: 0.83 mg/dL (ref 0.44–1.00)
Calcium: 8.9 mg/dL (ref 8.9–10.3)
GFR calc non Af Amer: >60 mL/min (ref >60)
Glucose, Bld: 325 mg/dL — ABNORMAL HIGH (ref 65–99)
Potassium: 4.2 mmol/L (ref 3.5–5.1)
SODIUM: 133 mmol/L — AB (ref 135–145)
Total Bilirubin: 0.9 mg/dL (ref 0.3–1.2)
Total Protein: 7.1 g/dL (ref 6.5–8.1)

## 2015-09-25 MED ORDER — OXYCODONE HCL 5 MG PO TABS: 5.0000 mg | ORAL_TABLET | ORAL | Status: DC | PRN

## 2015-09-25 NOTE — Progress Notes (Signed)
Discharge instructions reviewed with pt and prescription given.  Pt verbalized understanding and had no questions.  Pt discharged in stable condition via wheelchair with sister.  Eliezer [place]

## 2015-09-25 NOTE — Discharge Summary (Signed)
Physician Discharge Summary  Patient ID: Cynthia [name] MRN: [id number] DOB/AGE: 27/02/1991 27 y.o.  Admit date: 09/24/2015 Discharge date: 09/25/2015  Admission Diagnoses: s/p total thyroidectomy  Discharge Diagnoses:  Active Problems:   S/P total thyroidectomy   Discharged Condition: good  Hospital Course: Pt was admitted post op for OBS.  She did well overnight.  She had good pain control.  She was tolerating liquids well.  She was otherwise ambulating well.  She was afebrile while in the hospital and dee,ed stabled for DC and Dc'd home.  Consults: None  Significant Diagnostic Studies: labs: Ca normal POD 1  Treatments: surgery: as above  Discharge Exam: Blood pressure 106/58, pulse 72, temperature 97.7 F (36.5 C), temperature source Oral, resp. rate 18, weight 91 kg (200 lb 9.9 oz), last menstrual period 06/24/2015, SpO2 97 %. General appearance: alert and cooperative incision c/d/i  Disposition: Final discharge disposition not confirmed  Discharge Instructions    Diet - low sodium heart healthy    Complete by:  As directed      Increase activity slowly    Complete by:  As directed             Medication List    TAKE these medications        doxylamine (Sleep) 25 MG tablet  Commonly known as:  UNISOM  Take 25-50 mg by mouth at bedtime as needed for sleep.     oxyCODONE 5 MG immediate release tablet  Commonly known as:  Oxy IR/ROXICODONE  Take 1-2 tablets (5-10 mg total) by mouth every 4 (four) hours as needed for moderate pain.           Follow-up Information    Follow up with Reyes Ivan, MD. Schedule an appointment as soon as possible for a visit in 2 weeks.   Specialty:  General Surgery   Why:  For wound re-check   Contact information:   [address] [place] [address] [phone]       Signed: Rosario Jacks., Anne Hahn 09/25/2015, 7:24 AM

## 2015-09-29 ENCOUNTER — Telehealth: Payer: Self-pay | Admitting: General Surgery

## 2015-09-29 NOTE — Telephone Encounter (Signed)
I spoke with Cynthia [name] and notified her of her Path results.  We will be planning to send her to Endocrine for further assessment and likely RAI.

## 2015-10-01 NOTE — Anesthesia Postprocedure Evaluation (Signed)
Anesthesia Post Note  Patient: Cynthia [name]  Procedure(s) Performed: Procedure(s) (LRB): TOTAL THYROIDECTOMY (N/A)  Patient location during evaluation: PACU Anesthesia Type: General Level of consciousness: awake and alert Pain management: pain level controlled Vital Signs Assessment: post-procedure vital signs reviewed and stable Respiratory status: spontaneous breathing, nonlabored ventilation, respiratory function stable and patient connected to nasal cannula oxygen Cardiovascular status: blood pressure returned to baseline and stable Postop Assessment: no signs of nausea or vomiting Anesthetic complications: no     Last Vitals:  Filed Vitals:   09/25/15 0221 09/25/15 0539  BP: 118/71 106/58  Pulse: 83 72  Temp: 36.6 C 36.5 C  Resp: 19 18    Last Pain:  Filed Vitals:   09/25/15 1144  PainSc: 2    Pain Goal: Patients Stated Pain Goal: 3 (09/24/15 1238)               Catalina Gravel

## 2015-10-21 DIAGNOSIS — E063 Autoimmune thyroiditis: Secondary | ICD-10-CM | POA: Diagnosis not present

## 2015-10-21 DIAGNOSIS — R748 Abnormal levels of other serum enzymes: Secondary | ICD-10-CM | POA: Diagnosis not present

## 2015-10-21 DIAGNOSIS — R739 Hyperglycemia, unspecified: Secondary | ICD-10-CM | POA: Diagnosis not present

## 2015-10-21 DIAGNOSIS — C73 Malignant neoplasm of thyroid gland: Secondary | ICD-10-CM | POA: Diagnosis not present

## 2015-10-21 DIAGNOSIS — E1165 Type 2 diabetes mellitus with hyperglycemia: Secondary | ICD-10-CM | POA: Diagnosis not present

## 2015-10-21 DIAGNOSIS — E890 Postprocedural hypothyroidism: Secondary | ICD-10-CM | POA: Diagnosis not present

## 2015-10-24 ENCOUNTER — Other Ambulatory Visit: Payer: Self-pay | Admitting: Internal Medicine

## 2015-10-24 ENCOUNTER — Other Ambulatory Visit (HOSPITAL_COMMUNITY): Payer: Self-pay | Admitting: Internal Medicine

## 2015-10-24 DIAGNOSIS — R945 Abnormal results of liver function studies: Principal | ICD-10-CM

## 2015-10-24 DIAGNOSIS — C73 Malignant neoplasm of thyroid gland: Secondary | ICD-10-CM

## 2015-10-24 DIAGNOSIS — R7989 Other specified abnormal findings of blood chemistry: Secondary | ICD-10-CM

## 2015-10-28 DIAGNOSIS — E890 Postprocedural hypothyroidism: Secondary | ICD-10-CM | POA: Diagnosis not present

## 2015-10-28 DIAGNOSIS — E1165 Type 2 diabetes mellitus with hyperglycemia: Secondary | ICD-10-CM | POA: Diagnosis not present

## 2015-10-28 DIAGNOSIS — C73 Malignant neoplasm of thyroid gland: Secondary | ICD-10-CM | POA: Diagnosis not present

## 2015-10-29 ENCOUNTER — Ambulatory Visit
Admission: RE | Admit: 2015-10-29 | Discharge: 2015-10-29 | Disposition: A | Discharge status: Home / Self Care | Payer: BLUE CROSS/BLUE SHIELD | Source: Ambulatory Visit | Attending: Internal Medicine | Admitting: Internal Medicine

## 2015-10-29 DIAGNOSIS — R945 Abnormal results of liver function studies: Principal | ICD-10-CM

## 2015-10-29 DIAGNOSIS — E1165 Type 2 diabetes mellitus with hyperglycemia: Secondary | ICD-10-CM | POA: Diagnosis not present

## 2015-10-29 DIAGNOSIS — R7989 Other specified abnormal findings of blood chemistry: Secondary | ICD-10-CM

## 2015-11-13 ENCOUNTER — Encounter (HOSPITAL_COMMUNITY)
Admission: RE | Admit: 2015-11-13 | Discharge: 2015-11-13 | Disposition: A | Discharge status: Home / Self Care | Payer: BLUE CROSS/BLUE SHIELD | Source: Ambulatory Visit | Attending: Internal Medicine | Admitting: Internal Medicine

## 2015-11-13 DIAGNOSIS — C73 Malignant neoplasm of thyroid gland: Secondary | ICD-10-CM | POA: Diagnosis not present

## 2015-11-13 LAB — HCG, SERUM, QUALITATIVE: Preg, Serum: NEGATIVE

## 2015-11-13 MED ORDER — SODIUM IODIDE I 131 CAPSULE
54.1000 | Freq: Once | INTRAVENOUS | Status: AC | PRN
  Administered 2015-11-13: 54.1 via ORAL

## 2015-11-21 ENCOUNTER — Encounter (HOSPITAL_COMMUNITY)
Admission: RE | Admit: 2015-11-21 | Discharge: 2015-11-21 | Disposition: A | Discharge status: Home / Self Care | Payer: BLUE CROSS/BLUE SHIELD | Source: Ambulatory Visit | Attending: Internal Medicine | Admitting: Internal Medicine

## 2015-11-21 DIAGNOSIS — C73 Malignant neoplasm of thyroid gland: Secondary | ICD-10-CM | POA: Insufficient documentation

## 2015-12-02 DIAGNOSIS — E890 Postprocedural hypothyroidism: Secondary | ICD-10-CM | POA: Diagnosis not present

## 2015-12-22 DIAGNOSIS — E119 Type 2 diabetes mellitus without complications: Secondary | ICD-10-CM | POA: Diagnosis not present

## 2015-12-22 DIAGNOSIS — R748 Abnormal levels of other serum enzymes: Secondary | ICD-10-CM | POA: Diagnosis not present

## 2015-12-22 DIAGNOSIS — E890 Postprocedural hypothyroidism: Secondary | ICD-10-CM | POA: Diagnosis not present

## 2015-12-22 DIAGNOSIS — C73 Malignant neoplasm of thyroid gland: Secondary | ICD-10-CM | POA: Diagnosis not present

## 2016-01-20 DIAGNOSIS — E119 Type 2 diabetes mellitus without complications: Secondary | ICD-10-CM | POA: Diagnosis not present

## 2016-01-20 DIAGNOSIS — Z0100 Encounter for examination of eyes and vision without abnormal findings: Secondary | ICD-10-CM | POA: Diagnosis not present

## 2016-09-28 DIAGNOSIS — F411 Generalized anxiety disorder: Secondary | ICD-10-CM | POA: Diagnosis not present

## 2016-09-28 DIAGNOSIS — E890 Postprocedural hypothyroidism: Secondary | ICD-10-CM | POA: Diagnosis not present

## 2016-09-28 DIAGNOSIS — E1165 Type 2 diabetes mellitus with hyperglycemia: Secondary | ICD-10-CM | POA: Diagnosis not present

## 2017-04-04 DIAGNOSIS — E890 Postprocedural hypothyroidism: Secondary | ICD-10-CM | POA: Diagnosis not present

## 2017-09-07 ENCOUNTER — Inpatient Hospital Stay (HOSPITAL_COMMUNITY)
Admission: EM | Admit: 2017-09-07 | Discharge: 2017-09-09 | DRG: 917 | Disposition: A | Discharge status: Other Acute Inpatient Hospital | Payer: BLUE CROSS/BLUE SHIELD | Attending: Internal Medicine | Admitting: Internal Medicine

## 2017-09-07 ENCOUNTER — Other Ambulatory Visit: Payer: Self-pay

## 2017-09-07 ENCOUNTER — Emergency Department (HOSPITAL_COMMUNITY): Payer: BLUE CROSS/BLUE SHIELD

## 2017-09-07 ENCOUNTER — Inpatient Hospital Stay (HOSPITAL_COMMUNITY)
Admit: 2017-09-07 | Discharge: 2017-09-07 | Disposition: A | Discharge status: Home / Self Care | Payer: BLUE CROSS/BLUE SHIELD | Attending: Adult Health | Admitting: Adult Health

## 2017-09-07 ENCOUNTER — Inpatient Hospital Stay (HOSPITAL_COMMUNITY): Payer: BLUE CROSS/BLUE SHIELD

## 2017-09-07 ENCOUNTER — Encounter (HOSPITAL_COMMUNITY): Payer: Self-pay

## 2017-09-07 DIAGNOSIS — T391X2A Poisoning by 4-Aminophenol derivatives, intentional self-harm, initial encounter: Secondary | ICD-10-CM

## 2017-09-07 DIAGNOSIS — G4700 Insomnia, unspecified: Secondary | ICD-10-CM | POA: Diagnosis present

## 2017-09-07 DIAGNOSIS — F332 Major depressive disorder, recurrent severe without psychotic features: Secondary | ICD-10-CM

## 2017-09-07 DIAGNOSIS — T1491XA Suicide attempt, initial encounter: Secondary | ICD-10-CM | POA: Diagnosis not present

## 2017-09-07 DIAGNOSIS — T450X2A Poisoning by antiallergic and antiemetic drugs, intentional self-harm, initial encounter: Secondary | ICD-10-CM | POA: Diagnosis present

## 2017-09-07 DIAGNOSIS — E118 Type 2 diabetes mellitus with unspecified complications: Secondary | ICD-10-CM

## 2017-09-07 DIAGNOSIS — F411 Generalized anxiety disorder: Secondary | ICD-10-CM | POA: Diagnosis present

## 2017-09-07 DIAGNOSIS — F419 Anxiety disorder, unspecified: Secondary | ICD-10-CM | POA: Diagnosis not present

## 2017-09-07 DIAGNOSIS — G929 Unspecified toxic encephalopathy: Secondary | ICD-10-CM

## 2017-09-07 DIAGNOSIS — F329 Major depressive disorder, single episode, unspecified: Secondary | ICD-10-CM | POA: Diagnosis not present

## 2017-09-07 DIAGNOSIS — Z85850 Personal history of malignant neoplasm of thyroid: Secondary | ICD-10-CM | POA: Diagnosis not present

## 2017-09-07 DIAGNOSIS — J9690 Respiratory failure, unspecified, unspecified whether with hypoxia or hypercapnia: Secondary | ICD-10-CM | POA: Diagnosis present

## 2017-09-07 DIAGNOSIS — R000 Tachycardia, unspecified: Secondary | ICD-10-CM | POA: Diagnosis not present

## 2017-09-07 DIAGNOSIS — R402423 Glasgow coma scale score 9-12, at hospital admission: Secondary | ICD-10-CM | POA: Diagnosis present

## 2017-09-07 DIAGNOSIS — G92 Toxic encephalopathy: Secondary | ICD-10-CM | POA: Diagnosis present

## 2017-09-07 DIAGNOSIS — R569 Unspecified convulsions: Secondary | ICD-10-CM

## 2017-09-07 DIAGNOSIS — Z7289 Other problems related to lifestyle: Secondary | ICD-10-CM | POA: Diagnosis not present

## 2017-09-07 DIAGNOSIS — E872 Acidosis, unspecified: Secondary | ICD-10-CM

## 2017-09-07 DIAGNOSIS — E039 Hypothyroidism, unspecified: Secondary | ICD-10-CM | POA: Diagnosis not present

## 2017-09-07 DIAGNOSIS — T383X1A Poisoning by insulin and oral hypoglycemic [antidiabetic] drugs, accidental (unintentional), initial encounter: Secondary | ICD-10-CM | POA: Diagnosis not present

## 2017-09-07 DIAGNOSIS — Z7984 Long term (current) use of oral hypoglycemic drugs: Secondary | ICD-10-CM | POA: Diagnosis not present

## 2017-09-07 DIAGNOSIS — Z6379 Other stressful life events affecting family and household: Secondary | ICD-10-CM

## 2017-09-07 DIAGNOSIS — T383X2A Poisoning by insulin and oral hypoglycemic [antidiabetic] drugs, intentional self-harm, initial encounter: Secondary | ICD-10-CM | POA: Diagnosis not present

## 2017-09-07 DIAGNOSIS — I471 Supraventricular tachycardia: Secondary | ICD-10-CM | POA: Diagnosis not present

## 2017-09-07 DIAGNOSIS — Z599 Problem related to housing and economic circumstances, unspecified: Secondary | ICD-10-CM

## 2017-09-07 DIAGNOSIS — Y906 Blood alcohol level of 120-199 mg/100 ml: Secondary | ICD-10-CM | POA: Diagnosis present

## 2017-09-07 DIAGNOSIS — E119 Type 2 diabetes mellitus without complications: Secondary | ICD-10-CM

## 2017-09-07 DIAGNOSIS — Z915 Personal history of self-harm: Secondary | ICD-10-CM | POA: Diagnosis not present

## 2017-09-07 DIAGNOSIS — Z79899 Other long term (current) drug therapy: Secondary | ICD-10-CM | POA: Diagnosis not present

## 2017-09-07 DIAGNOSIS — E1165 Type 2 diabetes mellitus with hyperglycemia: Secondary | ICD-10-CM | POA: Diagnosis not present

## 2017-09-07 DIAGNOSIS — T43212A Poisoning by selective serotonin and norepinephrine reuptake inhibitors, intentional self-harm, initial encounter: Secondary | ICD-10-CM | POA: Diagnosis not present

## 2017-09-07 DIAGNOSIS — F1010 Alcohol abuse, uncomplicated: Secondary | ICD-10-CM

## 2017-09-07 DIAGNOSIS — E876 Hypokalemia: Secondary | ICD-10-CM | POA: Diagnosis not present

## 2017-09-07 DIAGNOSIS — I959 Hypotension, unspecified: Secondary | ICD-10-CM | POA: Diagnosis not present

## 2017-09-07 DIAGNOSIS — R9401 Abnormal electroencephalogram [EEG]: Secondary | ICD-10-CM | POA: Diagnosis present

## 2017-09-07 DIAGNOSIS — J9600 Acute respiratory failure, unspecified whether with hypoxia or hypercapnia: Secondary | ICD-10-CM | POA: Diagnosis not present

## 2017-09-07 DIAGNOSIS — E890 Postprocedural hypothyroidism: Secondary | ICD-10-CM | POA: Diagnosis not present

## 2017-09-07 DIAGNOSIS — R45851 Suicidal ideations: Secondary | ICD-10-CM | POA: Diagnosis not present

## 2017-09-07 DIAGNOSIS — J9811 Atelectasis: Secondary | ICD-10-CM | POA: Diagnosis present

## 2017-09-07 DIAGNOSIS — Z79890 Hormone replacement therapy (postmenopausal): Secondary | ICD-10-CM | POA: Diagnosis not present

## 2017-09-07 DIAGNOSIS — T50902A Poisoning by unspecified drugs, medicaments and biological substances, intentional self-harm, initial encounter: Secondary | ICD-10-CM

## 2017-09-07 DIAGNOSIS — Z9089 Acquired absence of other organs: Secondary | ICD-10-CM

## 2017-09-07 DIAGNOSIS — F4000 Agoraphobia, unspecified: Secondary | ICD-10-CM | POA: Diagnosis not present

## 2017-09-07 DIAGNOSIS — I4581 Long QT syndrome: Secondary | ICD-10-CM | POA: Diagnosis present

## 2017-09-07 DIAGNOSIS — T50901A Poisoning by unspecified drugs, medicaments and biological substances, accidental (unintentional), initial encounter: Secondary | ICD-10-CM | POA: Diagnosis present

## 2017-09-07 DIAGNOSIS — R531 Weakness: Secondary | ICD-10-CM | POA: Diagnosis not present

## 2017-09-07 DIAGNOSIS — T43592A Poisoning by other antipsychotics and neuroleptics, intentional self-harm, initial encounter: Secondary | ICD-10-CM | POA: Diagnosis not present

## 2017-09-07 DIAGNOSIS — T50902D Poisoning by unspecified drugs, medicaments and biological substances, intentional self-harm, subsequent encounter: Secondary | ICD-10-CM | POA: Diagnosis not present

## 2017-09-07 DIAGNOSIS — R4182 Altered mental status, unspecified: Secondary | ICD-10-CM | POA: Insufficient documentation

## 2017-09-07 DIAGNOSIS — T5192XA Toxic effect of unspecified alcohol, intentional self-harm, initial encounter: Secondary | ICD-10-CM | POA: Diagnosis not present

## 2017-09-07 LAB — CBC WITH DIFFERENTIAL/PLATELET
BASOS PCT: 0 %
BASOS PCT: 0 %
Basophils Absolute: 0 10*3/uL (ref 0.0–0.1)
Basophils Absolute: 0 10*3/uL (ref 0.0–0.1)
EOS ABS: 0.1 10*3/uL (ref 0.0–0.7)
EOS ABS: 0 10*3/uL (ref 0.0–0.7)
Eosinophils Relative: 1 %
Eosinophils Relative: 0 %
HCT: 37 % (ref 36.0–46.0)
HCT: 33.8 % — ABNORMAL LOW (ref 36.0–46.0)
HEMOGLOBIN: 12.3 g/dL (ref 12.0–15.0)
HEMOGLOBIN: 11 g/dL — AB (ref 12.0–15.0)
LYMPHS ABS: 1.5 10*3/uL (ref 0.7–4.0)
Lymphocytes Relative: 21 %
Lymphocytes Relative: 10 %
Lymphs Abs: 1.1 10*3/uL (ref 0.7–4.0)
MCH: 28.1 pg (ref 26.0–34.0)
MCH: 27.6 pg (ref 26.0–34.0)
MCHC: 33.2 g/dL (ref 30.0–36.0)
MCHC: 32.5 g/dL (ref 30.0–36.0)
MCV: 84.5 fL (ref 78.0–100.0)
MCV: 84.9 fL (ref 78.0–100.0)
Monocytes Absolute: 0.3 10*3/uL (ref 0.1–1.0)
Monocytes Absolute: 0.4 10*3/uL (ref 0.1–1.0)
Monocytes Relative: 4 %
Monocytes Relative: 3 %
NEUTROS PCT: 74 %
NEUTROS PCT: 87 %
Neutro Abs: 5.5 10*3/uL (ref 1.7–7.7)
Neutro Abs: 9.8 10*3/uL — ABNORMAL HIGH (ref 1.7–7.7)
Platelets: 172 10*3/uL (ref 150–400)
Platelets: 187 10*3/uL (ref 150–400)
RBC: 4.38 MIL/uL (ref 3.87–5.11)
RBC: 3.98 MIL/uL (ref 3.87–5.11)
RDW: 15.5 % (ref 11.5–15.5)
RDW: 15.5 % (ref 11.5–15.5)
WBC: 7.3 10*3/uL (ref 4.0–10.5)
WBC: 11.4 10*3/uL — AB (ref 4.0–10.5)

## 2017-09-07 LAB — VOLATILES,BLD-ACETONE,ETHANOL,ISOPROP,METHANOL
Acetone, blood: NEGATIVE % (ref 0.000–0.010)
Ethanol, blood: NEGATIVE % (ref 0.000–0.010)
ISOPROPANOL, BLOOD: NEGATIVE % (ref 0.000–0.010)
METHANOL, BLOOD: NEGATIVE % (ref 0.000–0.010)

## 2017-09-07 LAB — TROPONIN I
Troponin I: <0.03 ng/mL (ref <0.03)
Troponin I: <0.03 ng/mL (ref <0.03)
Troponin I: <0.03 ng/mL (ref <0.03)

## 2017-09-07 LAB — BLOOD GAS, ARTERIAL
ACID-BASE DEFICIT: 16.4 mmol/L — AB (ref 0.0–2.0)
ACID-BASE DEFICIT: 2.3 mmol/L — AB (ref 0.0–2.0)
Acid-base deficit: 21.4 mmol/L — ABNORMAL HIGH (ref 0.0–2.0)
BICARBONATE: 20.7 mmol/L (ref 20.0–28.0)
Bicarbonate: 6.9 mmol/L — ABNORMAL LOW (ref 20.0–28.0)
Bicarbonate: 9.7 mmol/L — ABNORMAL LOW (ref 20.0–28.0)
DRAWN BY: 103701
DRAWN BY: 295031
Drawn by: 11249
FIO2: 21
FIO2: 21
FIO2: 21
O2 Saturation: 96.2 %
O2 Saturation: 96.9 %
O2 Saturation: 98.1 %
PATIENT TEMPERATURE: 98.6
PCO2 ART: 24.5 mmHg — AB (ref 32.0–48.0)
PO2 ART: 117 mmHg — AB (ref 83.0–108.0)
Patient temperature: 95.7
Patient temperature: 98.6
pCO2 arterial: 20.9 mmHg — ABNORMAL LOW (ref 32.0–48.0)
pCO2 arterial: 31.7 mmHg — ABNORMAL LOW (ref 32.0–48.0)
pH, Arterial: 7.13 — CL (ref 7.350–7.450)
pH, Arterial: 7.223 — ABNORMAL LOW (ref 7.350–7.450)
pH, Arterial: 7.431 (ref 7.350–7.450)
pO2, Arterial: 127 mmHg — ABNORMAL HIGH (ref 83.0–108.0)
pO2, Arterial: 103 mmHg (ref 83.0–108.0)

## 2017-09-07 LAB — LIPASE, BLOOD
Lipase: 24 U/L (ref 11–51)
Lipase: 28 U/L (ref 11–51)

## 2017-09-07 LAB — COMPREHENSIVE METABOLIC PANEL
ALT: 35 U/L (ref 14–54)
ALT: 37 U/L (ref 14–54)
AST: 34 U/L (ref 15–41)
AST: 27 U/L (ref 15–41)
Albumin: 4.1 g/dL (ref 3.5–5.0)
Albumin: 4.1 g/dL (ref 3.5–5.0)
Alkaline Phosphatase: 60 U/L (ref 38–126)
Alkaline Phosphatase: 58 U/L (ref 38–126)
Anion gap: 18 — ABNORMAL HIGH (ref 5–15)
Anion gap: 17 — ABNORMAL HIGH (ref 5–15)
BILIRUBIN TOTAL: 1.2 mg/dL (ref 0.3–1.2)
BUN: 13 mg/dL (ref 6–20)
BUN: 10 mg/dL (ref 6–20)
CALCIUM: 7.9 mg/dL — AB (ref 8.9–10.3)
CHLORIDE: 107 mmol/L (ref 101–111)
CO2: 15 mmol/L — AB (ref 22–32)
CO2: 22 mmol/L (ref 22–32)
CREATININE: 0.89 mg/dL (ref 0.44–1.00)
Calcium: 8.6 mg/dL — ABNORMAL LOW (ref 8.9–10.3)
Chloride: 105 mmol/L (ref 101–111)
Creatinine, Ser: 0.86 mg/dL (ref 0.44–1.00)
GFR calc Af Amer: >60 mL/min (ref >60)
GFR calc Af Amer: >60 mL/min (ref >60)
GFR calc non Af Amer: >60 mL/min (ref >60)
GFR calc non Af Amer: >60 mL/min (ref >60)
Glucose, Bld: 170 mg/dL — ABNORMAL HIGH (ref 65–99)
Glucose, Bld: 157 mg/dL — ABNORMAL HIGH (ref 65–99)
Potassium: 3.3 mmol/L — ABNORMAL LOW (ref 3.5–5.1)
Potassium: 3.5 mmol/L (ref 3.5–5.1)
SODIUM: 140 mmol/L (ref 135–145)
Sodium: 144 mmol/L (ref 135–145)
TOTAL PROTEIN: 8.4 g/dL — AB (ref 6.5–8.1)
Total Bilirubin: 0.2 mg/dL — ABNORMAL LOW (ref 0.3–1.2)
Total Protein: 8 g/dL (ref 6.5–8.1)

## 2017-09-07 LAB — URINALYSIS, ROUTINE W REFLEX MICROSCOPIC
Bilirubin Urine: NEGATIVE
KETONES UR: 80 mg/dL — AB
LEUKOCYTES UA: NEGATIVE
Nitrite: NEGATIVE
PROTEIN: NEGATIVE mg/dL
Specific Gravity, Urine: 1.007 (ref 1.005–1.030)
pH: 5 (ref 5.0–8.0)

## 2017-09-07 LAB — COMPREHENSIVE METABOLIC PANEL WITH GFR
ALT: 31 U/L (ref 14–54)
AST: 33 U/L (ref 15–41)
Albumin: 3.5 g/dL (ref 3.5–5.0)
Alkaline Phosphatase: 51 U/L (ref 38–126)
Anion gap: 21 — ABNORMAL HIGH (ref 5–15)
BUN: 11 mg/dL (ref 6–20)
CO2: 13 mmol/L — ABNORMAL LOW (ref 22–32)
Calcium: 7.3 mg/dL — ABNORMAL LOW (ref 8.9–10.3)
Chloride: 109 mmol/L (ref 101–111)
Creatinine, Ser: 0.9 mg/dL (ref 0.44–1.00)
GFR calc Af Amer: >60 mL/min (ref >60)
GFR calc non Af Amer: >60 mL/min (ref >60)
Glucose, Bld: 186 mg/dL — ABNORMAL HIGH (ref 65–99)
Potassium: 3.8 mmol/L (ref 3.5–5.1)
Sodium: 143 mmol/L (ref 135–145)
Total Bilirubin: 0.4 mg/dL (ref 0.3–1.2)
Total Protein: 7 g/dL (ref 6.5–8.1)

## 2017-09-07 LAB — RAPID URINE DRUG SCREEN, HOSP PERFORMED
Amphetamines: NOT DETECTED
Barbiturates: NOT DETECTED
Benzodiazepines: NOT DETECTED
COCAINE: NOT DETECTED
OPIATES: NOT DETECTED
Tetrahydrocannabinol: NOT DETECTED

## 2017-09-07 LAB — TSH: TSH: 23.447 u[IU]/mL — AB (ref 0.350–4.500)

## 2017-09-07 LAB — PROTIME-INR
INR: 1.2
Prothrombin Time: 15.1 s (ref 11.4–15.2)

## 2017-09-07 LAB — BRAIN NATRIURETIC PEPTIDE: B NATRIURETIC PEPTIDE 5: 22.5 pg/mL (ref 0.0–100.0)

## 2017-09-07 LAB — ETHANOL: ALCOHOL ETHYL (B): 121 mg/dL — AB (ref <10)

## 2017-09-07 LAB — BASIC METABOLIC PANEL
Anion gap: 19 — ABNORMAL HIGH (ref 5–15)
BUN: 12 mg/dL (ref 6–20)
CHLORIDE: 104 mmol/L (ref 101–111)
CO2: 19 mmol/L — AB (ref 22–32)
CREATININE: 0.92 mg/dL (ref 0.44–1.00)
Calcium: 7.7 mg/dL — ABNORMAL LOW (ref 8.9–10.3)
GFR calc Af Amer: >60 mL/min (ref >60)
GFR calc non Af Amer: >60 mL/min (ref >60)
GLUCOSE: 174 mg/dL — AB (ref 65–99)
Potassium: 3.8 mmol/L (ref 3.5–5.1)
Sodium: 142 mmol/L (ref 135–145)

## 2017-09-07 LAB — ETHYLENE GLYCOL: Ethylene Glycol Lvl: NOT DETECTED mg/dL

## 2017-09-07 LAB — LACTIC ACID, PLASMA
Lactic Acid, Venous: 6.5 mmol/L (ref 0.5–1.9)
Lactic Acid, Venous: 10.7 mmol/L (ref 0.5–1.9)
Lactic Acid, Venous: 7.8 mmol/L (ref 0.5–1.9)
Lactic Acid, Venous: 2.9 mmol/L (ref 0.5–1.9)
Lactic Acid, Venous: 2.8 mmol/L (ref 0.5–1.9)

## 2017-09-07 LAB — HCG, SERUM, QUALITATIVE: Preg, Serum: NEGATIVE

## 2017-09-07 LAB — AMYLASE: Amylase: 28 U/L (ref 28–100)

## 2017-09-07 LAB — SALICYLATE LEVEL

## 2017-09-07 LAB — PROCALCITONIN: Procalcitonin: <0.1 ng/mL

## 2017-09-07 LAB — CORTISOL: Cortisol, Plasma: 25.2 ug/dL

## 2017-09-07 LAB — MRSA PCR SCREENING: MRSA by PCR: NEGATIVE

## 2017-09-07 LAB — I-STAT BETA HCG BLOOD, ED (MC, WL, AP ONLY): I-stat hCG, quantitative: <5 m[IU]/mL (ref <5)

## 2017-09-07 LAB — ACETAMINOPHEN LEVEL: ACETAMINOPHEN (TYLENOL), SERUM: 36 ug/mL — AB (ref 10–30)

## 2017-09-07 LAB — HEMOGLOBIN A1C
Hgb A1c MFr Bld: 7.6 % — ABNORMAL HIGH (ref 4.8–5.6)
Mean Plasma Glucose: 171.42 mg/dL

## 2017-09-07 LAB — MAGNESIUM
Magnesium: 1.8 mg/dL (ref 1.7–2.4)
Magnesium: 1.5 mg/dL — ABNORMAL LOW (ref 1.7–2.4)

## 2017-09-07 LAB — APTT: aPTT: 30 s (ref 24–36)

## 2017-09-07 LAB — PHOSPHORUS: PHOSPHORUS: 2.5 mg/dL (ref 2.5–4.6)

## 2017-09-07 MED ORDER — ACETYLCYSTEINE 200 MG/ML IV SOLN
15.0000 mg/kg/h | INTRAVENOUS | Status: DC
  Filled 2017-09-07: qty 200

## 2017-09-07 MED ORDER — ACETYLCYSTEINE LOAD VIA INFUSION: 150.0000 mg/kg | Freq: Once | INTRAVENOUS | Status: DC

## 2017-09-07 MED ORDER — SODIUM CHLORIDE 0.9 % IV SOLN: 250.0000 mL | INTRAVENOUS | Status: DC | PRN

## 2017-09-07 MED ORDER — ACETYLCYSTEINE LOAD VIA INFUSION
150.0000 mg/kg | Freq: Once | INTRAVENOUS | Status: AC
  Administered 2017-09-07: 14295 mg via INTRAVENOUS
  Filled 2017-09-07: qty 358

## 2017-09-07 MED ORDER — DEXTROSE 5 % IV SOLN: 15.0000 mg/kg/h | INTRAVENOUS | Status: DC

## 2017-09-07 MED ORDER — ENOXAPARIN SODIUM 40 MG/0.4ML ~~LOC~~ SOLN: 40.0000 mg | SUBCUTANEOUS | Status: DC

## 2017-09-07 MED ORDER — PYRIDOXINE HCL 100 MG/ML IJ SOLN
100.0000 mg | Freq: Once | INTRAMUSCULAR | Status: AC
  Administered 2017-09-07: 100 mg via INTRAVENOUS
  Filled 2017-09-07 (×2): qty 1

## 2017-09-07 MED ORDER — PHENYLEPHRINE HCL-NACL 10-0.9 MG/250ML-% IV SOLN
20.0000 ug/min | INTRAVENOUS | Status: DC
  Administered 2017-09-07: 20 ug/min via INTRAVENOUS
  Filled 2017-09-07: qty 250

## 2017-09-07 MED ORDER — DEXTROSE 5 % IV SOLN
15.0000 mg/kg/h | INTRAVENOUS | Status: DC
  Administered 2017-09-08: 15 mg/kg/h via INTRAVENOUS
  Filled 2017-09-07: qty 90
  Filled 2017-09-07: qty 200

## 2017-09-07 MED ORDER — ONDANSETRON HCL 4 MG/2ML IJ SOLN: 4.0000 mg | Freq: Once | INTRAMUSCULAR | Status: DC

## 2017-09-07 MED ORDER — POTASSIUM CHLORIDE 10 MEQ/100ML IV SOLN
10.0000 meq | INTRAVENOUS | Status: AC
  Administered 2017-09-07: 10 meq via INTRAVENOUS
  Filled 2017-09-07 (×2): qty 100

## 2017-09-07 MED ORDER — MAGNESIUM SULFATE IN D5W 1-5 GM/100ML-% IV SOLN
1.0000 g | Freq: Once | INTRAVENOUS | Status: AC
  Administered 2017-09-07: 1 g via INTRAVENOUS
  Filled 2017-09-07: qty 100

## 2017-09-07 MED ORDER — LORAZEPAM 2 MG/ML IJ SOLN
1.0000 mg | INTRAMUSCULAR | Status: DC | PRN
  Administered 2017-09-07: 1 mg via INTRAVENOUS
  Filled 2017-09-07: qty 1

## 2017-09-07 MED ORDER — ORAL CARE MOUTH RINSE
15.0000 mL | Freq: Two times a day (BID) | OROMUCOSAL | Status: DC
  Administered 2017-09-07 – 2017-09-09 (×4): 15 mL via OROMUCOSAL

## 2017-09-07 MED ORDER — SODIUM BICARBONATE 8.4 % IV SOLN
INTRAVENOUS | Status: DC
  Administered 2017-09-07: 09:00:00 via INTRAVENOUS
  Filled 2017-09-07: qty 150

## 2017-09-07 MED ORDER — LACTATED RINGERS IV SOLN
INTRAVENOUS | Status: DC
  Administered 2017-09-07 – 2017-09-08 (×2): via INTRAVENOUS

## 2017-09-07 MED ORDER — SODIUM CHLORIDE 0.9 % IV SOLN: 10.0000 mg/kg | Freq: Two times a day (BID) | INTRAVENOUS | Status: DC

## 2017-09-07 MED ORDER — LORAZEPAM 2 MG/ML IJ SOLN
INTRAMUSCULAR | Status: AC
  Administered 2017-09-07: 1 mg via INTRAVENOUS
  Filled 2017-09-07: qty 1

## 2017-09-07 MED ORDER — ONDANSETRON HCL 4 MG/2ML IJ SOLN
4.0000 mg | Freq: Once | INTRAMUSCULAR | Status: AC
  Administered 2017-09-07: 4 mg via INTRAVENOUS
  Filled 2017-09-07: qty 2

## 2017-09-07 MED ORDER — SODIUM BICARBONATE 8.4 % IV SOLN
50.0000 meq | Freq: Once | INTRAVENOUS | Status: AC
  Administered 2017-09-07: 50 meq via INTRAVENOUS
  Filled 2017-09-07: qty 50

## 2017-09-07 MED ORDER — SODIUM CHLORIDE 0.9 % IV SOLN
15.0000 mg/kg | INTRAVENOUS | Status: AC
  Administered 2017-09-07: 1430 mg via INTRAVENOUS
  Filled 2017-09-07: qty 1.43

## 2017-09-07 MED ORDER — ACETYLCYSTEINE LOAD VIA INFUSION
150.0000 mg/kg | Freq: Once | INTRAVENOUS | Status: DC
  Filled 2017-09-07: qty 358

## 2017-09-07 MED ORDER — SODIUM CHLORIDE 0.9 % IV BOLUS
1000.0000 mL | Freq: Once | INTRAVENOUS | Status: AC
  Administered 2017-09-07: 1000 mL via INTRAVENOUS

## 2017-09-07 MED ORDER — THIAMINE HCL 100 MG/ML IJ SOLN
100.0000 mg | Freq: Once | INTRAMUSCULAR | Status: AC
  Administered 2017-09-07: 100 mg via INTRAVENOUS
  Filled 2017-09-07: qty 2

## 2017-09-07 MED ORDER — LORAZEPAM 2 MG/ML IJ SOLN
1.0000 mg | Freq: Once | INTRAMUSCULAR | Status: AC
  Administered 2017-09-07: 1 mg via INTRAVENOUS

## 2017-09-07 MED ORDER — SODIUM CHLORIDE 0.9 % IV SOLN
INTRAVENOUS | Status: DC
  Administered 2017-09-07: 08:00:00 via INTRAVENOUS

## 2017-09-07 MED ORDER — FOMEPIZOLE 1 GM/ML IV SOLN
10.0000 mg/kg | Freq: Two times a day (BID) | INTRAVENOUS | Status: DC
  Filled 2017-09-07: qty 0.82

## 2017-09-07 NOTE — Progress Notes (Signed)
CRITICAL VALUE ALERT  Critical Value:  Lactic 2.8 Date & Time Notied:  09/07/2017 1501  Provider Notified: Joellen Jersey NP   Orders Received/Actions taken: trending down no new orders

## 2017-09-07 NOTE — Progress Notes (Signed)
Notified MD Manaam in regards to patient's Neuro Status. Patient still having delayed responses. MD Manaam placed order for CT of Head STAT.

## 2017-09-07 NOTE — Progress Notes (Signed)
CRITICAL VALUE ALERT  Critical Value:  LA 7.8   Date & Time Notified:  09/07/17 and 8563  Provider Notified: MD Manaam   Orders Received/Actions taken: No new orders at this time, will monitor and assess.

## 2017-09-07 NOTE — Procedures (Signed)
ELECTROENCEPHALOGRAM REPORT  Date of Study: 09/07/2017  Patient's Name: Cynthia [name] MRN: [id number] Date of Birth: [birth year]  Referring Provider: Darlina Sicilian, NP  Clinical History: This is a 27 year old woman with altered mental status.  Medications: No sedating medications listed  Technical Summary: A multichannel digital EEG recording measured by the international 10-20 system with electrodes applied with paste and impedances below 5000 ohms performed as portable with EKG monitoring in an awake and drowsy patient.  Hyperventilation and photic stimulation were not performed.  The digital EEG was referentially recorded, reformatted, and digitally filtered in a variety of bipolar and referential montages for optimal display.   Description: The patient is awake and drowsy during the recording. Patient does not follow commands. There is no clear posterior dominant rhythm. The background consists of a large amount of diffuse low to medium voltage 4-5 Hz theta and 2-3 Hz delta slowing, at times sharply contoured over the occipital regions. Normal sleep architecture is not seen. Hyperventilation and photic stimulation were not performed.  There were no epileptiform discharges or electrographic seizures seen.    EKG lead showed sinus tachycardia.  Impression: This awake and drowsy EEG is abnormal due to moderate diffuse background slowing.  Clinical Correlation of the above findings indicates diffuse cerebral dysfunction that is non-specific in etiology and can be seen with hypoxic/ischemic injury, toxic/metabolic encephalopathies, or medication effect.  The absence of epileptiform discharges does not rule out a clinical diagnosis of epilepsy.  Clinical correlation is advised.   Ellouise Newer, M.D.

## 2017-09-07 NOTE — ED Notes (Signed)
Bed: OJ75 Expected date:  Expected time:  Means of arrival:  Comments: EMS overdose

## 2017-09-07 NOTE — H&P (Signed)
PULMONARY / CRITICAL CARE MEDICINE   Name: Cynthia [name] MRN: [id number] DOB: [birth year]    ADMISSION DATE:  09/07/2017 CONSULTATION DATE:  09/07/2017  REFERRING MD:  Dr. 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  Presented from home 6/12 with altered mental status.  Family reports patient has been under a lot of stress and possible ETOH abuse, trying to pay off debt, recently got kicked out of school and lost finical aid, and apparently is the anniversary of her mother's death.  She was found vomiting by her sister with several bottles of her sister's medications including BuSpar, metformin, Jardiance, trazodone, and Midol.  No previous psychiatric admissions.  She stated to the ER staff she wanted to die and left a suicide note on her phone.    In the ER, she remained altered and hypotensive, fluid responsive thus far.  She had witnessed seizure in the ER, treated with ativan.  Labs noted for tylenol level 36, ETOH 371, neg salicylate, AG 18, lactic acid 6.5, and other labs pending.  EKG with ST and prolonged QTc 0.496. Poision control notified.  She was started on Mycomyst in ER.  PCCM called for admission.    PAST MEDICAL HISTORY :  She  has a past medical history of Cancer ([place]), Depression, GERD (gastroesophageal reflux disease), and Hypothyroidism.  PAST SURGICAL HISTORY: She  has a past surgical history that includes Pilonidal cyst drainage; Thyroidectomy (09/24/2015); and Thyroidectomy (N/A, 09/24/2015).  No Known Allergies  No current facility-administered medications on file prior to encounter.    Current Outpatient Medications on File Prior to Encounter  Medication Sig  . buPROPion (WELLBUTRIN XL) 300 MG 24 hr tablet Take 300 mg by mouth daily.  . empagliflozin (JARDIANCE) 25 MG TABS tablet Take 25 mg by  mouth daily.  . metFORMIN (GLUCOPHAGE-XR) 500 MG 24 hr tablet Take 500 mg by mouth daily with breakfast.  . traZODone (DESYREL) 50 MG tablet Take 50 mg by mouth at bedtime as needed for sleep.  Marland Kitchen oxyCODONE (OXY IR/ROXICODONE) 5 MG immediate release tablet Take 1-2 tablets (5-10 mg total) by mouth every 4 (four) hours as needed for moderate pain. (Patient not taking: Reported on 09/07/2017)    FAMILY HISTORY:  Her has no family status information on file.    SOCIAL HISTORY: She  reports that she has never smoked. She has never used smokeless tobacco. She reports that she drinks about 3.0 oz of alcohol per week. She reports that she does not use drugs.  REVIEW OF SYSTEMS:   Unable - pt sedated on vent. As per HPI above obtained from records and staff.   SUBJECTIVE:    VITAL SIGNS: BP 114/64   Pulse (!) 126   Temp (!) 96.3 F (35.7 C)   Resp (!) 26   Ht 5\' 4"  (1.626 m)   Wt 95.3 kg (210 lb)   SpO2 98%   BMI 36.05 kg/m   HEMODYNAMICS:    VENTILATOR SETTINGS:    INTAKE / OUTPUT: I/O last 3 completed shifts: In: 500 [I.V.:500] Out: -   PHYSICAL EXAMINATION: General:  Young female, NAD  Neuro:  Altered, answers some questions, follow commands intermittently, pupils dilated but reactive, no focal deficits HEENT:  Mm moist, no JVD  Cardiovascular:  s1s2 rrr Lungs:  resps even non labored on [place], diminished  bases  Abdomen:  Round, soft, +bs  Musculoskeletal:  Warm and dry, no edema   LABS:  BMET Recent Labs  Lab 09/07/17 0522  NA 140  K 3.3*  CL 107  CO2 15*  BUN 13  CREATININE 0.86  GLUCOSE 170*    Electrolytes Recent Labs  Lab 09/07/17 0514 09/07/17 0522  CALCIUM  --  8.6*  MG 1.8  --     CBC Recent Labs  Lab 09/07/17 0514  WBC 7.3  HGB 12.3  HCT 37.0  PLT 172    Coag's No results for input(s): APTT, INR in the last 168 hours.  Sepsis Markers Recent Labs  Lab 09/07/17 0547 09/07/17 0703  LATICACIDVEN 6.5* 10.7*    ABG Recent Labs   Lab 09/07/17 0657  PHART 7.130*  PCO2ART 20.9*  PO2ART 127*    Liver Enzymes Recent Labs  Lab 09/07/17 0522  AST 34  ALT 35  ALKPHOS 60  BILITOT 0.2*  ALBUMIN 4.1    Cardiac Enzymes No results for input(s): TROPONINI, PROBNP in the last 168 hours.  Glucose No results for input(s): GLUCAP in the last 168 hours.  Imaging Dg Chest Port 1 View  Result Date: 09/07/2017 CLINICAL DATA:  Drug overdose EXAM: PORTABLE CHEST 1 VIEW COMPARISON:  September 24, 2015 FINDINGS: Lungs are clear. The heart size and pulmonary vascularity are normal. No adenopathy. No bone lesions. No pneumothorax. IMPRESSION: No edema or consolidation. Electronically Signed   By: Lowella Grip III M.D.   On: 09/07/2017 07:19     STUDIES:    CULTURES:   ANTIBIOTICS:   SIGNIFICANT EVENTS:   LINES/TUBES:   DISCUSSION: 27yo female with intentional OD of multiple medications including BuSpar, metformin, Jardiance, trazodone, and Midol.  Persistent lactic acidosis, hypotension.  Tenuous respiratory status r/t AMS.   ASSESSMENT / PLAN:  PULMONARY A: High risk for intubation- given acute encephalopathy  Respiratory compensation for acute metabolic acidosis  P:   Supplemental O2 as needed  Close monitoring of resp status given acute encephalopathy  F/u ABG  High risk for intubation - if needs intubation will need HCO3 pushes, high minute ventilation to match her current respiratory compensation   CARDIOVASCULAR A:  Hypotension related to overdose  Prolonged QTc  P:  Gentle volume  Low dose peripheral neo gtt - wean as able to maintain MAP >65 F/u EKG   RENAL A:   Lactic acidosis related to probable metformin toxicity  hypokalemia - mild  P:   F/u chem, mg, phos  Trend lactate  F/u ABG  HCO3 gtt  Fomepizole given in ER  May need renal to see if acidosis does not start to turn around   GASTROINTESTINAL Tylenol OD  ETOH abuse  P:   F/u LFT's  Mucomyst gtt  F/u tylenol level   Salicylate level wnl  Thiamine, folate   HEMATOLOGIC No active issue  P:  F/u CBC  SCD's, lovenox   INFECTIOUS No active issue  P:   Monitor wbc, fever curve   ENDOCRINE A:   Hyperglycemia  Hx thyroid cancer s/p thyroidectomy  ?DM2 P:   Hourly CBG checks given possible OD on diabetic meds hgbA1c Check TSH   NEUROLOGIC A:   Acute encephalopathy related to intentional OD- buspar, metformin, Jardiance, trazodone, and Midol ??Seizure in ER - poison control notified in ER P:   Frequent neuro checks Safety sitter at bedside  EEG    FAMILY  - Updates:  Dad updated at bedside 6/12  -  Inter-disciplinary family meet or Palliative Care meeting due by:  Day 7     Nickolas Madrid, NP 09/07/2017  9:00 AM Pager: [phone] or [phone]

## 2017-09-07 NOTE — Progress Notes (Signed)
EEG Completed; Results Pending  

## 2017-09-07 NOTE — Progress Notes (Signed)
MEDICATION RELATED CONSULT NOTE - INITIAL   Pharmacy Consult for Acetadote Indication: OD uknown medications, Apap=36  No Known Allergies  Patient Measurements:   Wt=95.3 kg  Vital Signs: BP: 112/74 (06/12 0530) Pulse Rate: 114 (06/12 0530) Intake/Output from previous day: 06/11 0701 - 06/12 0700 In: 500 [I.V.:500] Out: -  Intake/Output from this shift: Total I/O In: 500 [I.V.:500] Out: -   Labs: Recent Labs    09/07/17 0514 09/07/17 0522  WBC 7.3  --   HGB 12.3  --   HCT 37.0  --   PLT 172  --   CREATININE  --  0.86  MG 1.8  --   ALBUMIN  --  4.1  PROT  --  8.0  AST  --  34  ALT  --  35  ALKPHOS  --  60  BILITOT  --  0.2*   CrCl cannot be calculated (Unknown ideal weight.).   Microbiology: No results found for this or any previous visit (from the past 720 hour(s)).  Medical History: Past Medical History:  Diagnosis Date  . Cancer ([place])    thyroid  . Depression   . GERD (gastroesophageal reflux disease)   . Hypothyroidism     Medications:  Scheduled:  . acetylcysteine  150 mg/kg Intravenous Once  . acetylcysteine  150 mg/kg Intravenous Once  . LORazepam      . ondansetron (ZOFRAN) IV  4 mg Intravenous Once   Infusions:  . acetylcysteine    . sodium chloride 1,000 mL (09/07/17 0600)  . sodium chloride      Assessment: 15 yoF brought in after ingestion of unknown amounts of alcohol and multiple medications. Apap = 36  Goal of Therapy:  Prevent liver toxicity  Plan:  STAT Wt and Ht Acetadote 150 mg/kg over 1 hour Start Acetadote infusion at 15 mg/kg/hr F/u Apap levels, PT/INR, LFTs and PC recommendations.   Lawana Pai R 09/07/2017,6:28 AM

## 2017-09-07 NOTE — ED Triage Notes (Signed)
Pt arriving from home via EMS. Pt brought in due to unknown amount of alcohol consumption, and ingestion of multiple medications in unknown amounts. Pt states she wants to die. Reported to EMS it is the anniversary of her mothers death, she recently got kicked out of school, and lost financial aid.

## 2017-09-07 NOTE — ED Notes (Signed)
Date and time results received: 09/07/17 8:09 AM  Test: Lactic Acid Critical Value: 10.7  Name of Provider Notified: Pfeiffer MD  Orders Received? Or Actions Taken?: MD verbalizes calling critical care at present

## 2017-09-07 NOTE — ED Provider Notes (Signed)
Patient has been managed by Dr. Tomi Bamberger and consultations placed to intensivist and toxicology.  See her note.  Nurses advised lactic had increased to 10.7.  I did go to reevaluate the patient.  She shows no signs of respiratory distress.  Color is good. MAP is 72. Nursing staff is initiating phenylephrine as per orders from intensivist.  I have introduced myself to the patient's father and brother who are at bedside.  At this time, Dr. Vaughan Browner has arrived at bedside.  He is aware of rise in lactic acid and will proceed with ongoing management.   Charlesetta Shanks, MD 09/07/17 [phone]

## 2017-09-07 NOTE — Progress Notes (Signed)
Notified Tiffany Kocher CCM NP in regards to patient's having order for Normal Saline 0.9 % 139ml/hr and Sodium Bicarb infusion at 178ml/hr. Tiffany Kocher CCM NP ordered to discontinue the normal saline infusion and continue with the sodium bicarb. Notified [place] NP about 3.8 K on the last BMET. [place] NP ordered to finish 1, 39meq of potassium IV and 1g of Magnesium IV, will hold other runs of potassium. Next BMET will be drawn around noon.

## 2017-09-07 NOTE — Progress Notes (Signed)
[name] Progress Note Patient Name: Cynthia [name] DOB: [birth year] MRN: [id number]   Date of Service  09/07/2017  HPI/Events of Note  Serum tox neg for alcohols  eICU Interventions  Dc fomepizole zofran x 1, monitor qTC     Intervention Category Intermediate Interventions: Diagnostic test evaluation  Rakesh V. Alva 09/07/2017, 10:16 PM

## 2017-09-07 NOTE — ED Provider Notes (Addendum)
[facility] DEPT Provider Note   CSN: [id number] Arrival date & time: 09/07/17  0450  Time seen 04:55 AM   History   Chief Complaint Chief Complaint  Patient presents with  . Drug Overdose   Level 5 caveat for altered mental status  HPI Cynthia [name] is a 27 y.o. female.  HPI patient presents via EMS who states that his sister called for possible overdose.  They report her initial blood pressure was in the 90s and they gave her some fluids and it improved to 144/64.  They state they found bottles for BuSpar, metformin, Jardiance, trazodone, and Midol.  They do not know when the ingestion occurred.  Patient does not answer questions verbally.  PCP Corine Shelter, PA-C   Past Medical History:  Diagnosis Date  . Cancer ([place])    thyroid  . Depression   . GERD (gastroesophageal reflux disease)   . Hypothyroidism     Patient Active Problem List   Diagnosis Date Noted  . S/P total thyroidectomy 09/24/2015    Past Surgical History:  Procedure Laterality Date  . PILONIDAL CYST DRAINAGE    . THYROIDECTOMY  09/24/2015  . THYROIDECTOMY N/A 09/24/2015   Procedure: TOTAL THYROIDECTOMY;  Surgeon: Ralene Ok, MD;  Location: [facility];  Service: General;  Laterality: N/A;     OB History   None      Home Medications    Prior to Admission medications   Medication Sig Start Date End Date Taking? Authorizing Provider  buPROPion (WELLBUTRIN XL) 300 MG 24 hr tablet Take 300 mg by mouth daily.   Yes [provider]  empagliflozin (JARDIANCE) 25 MG TABS tablet Take 25 mg by mouth daily.   Yes [provider]  metFORMIN (GLUCOPHAGE-XR) 500 MG 24 hr tablet Take 500 mg by mouth daily with breakfast.   Yes [provider]  traZODone (DESYREL) 50 MG tablet Take 50 mg by mouth at bedtime as needed for sleep.   Yes [provider]  oxyCODONE (OXY IR/ROXICODONE) 5 MG immediate release tablet Take 1-2 tablets (5-10 mg  total) by mouth every 4 (four) hours as needed for moderate pain. Patient not taking: Reported on 09/07/2017 09/25/15   Ralene Ok, MD    Family History No family history on file.  Social History Social History   Tobacco Use  . Smoking status: Never Smoker  . Smokeless tobacco: Never Used  Substance Use Topics  . Alcohol use: Yes    Alcohol/week: 3.0 oz    Types: 5 Shots of liquor per week    Comment: white russian...  . Drug use: No  college student   Allergies   Patient has no known allergies.   Review of Systems Review of Systems  All other systems reviewed and are negative.    Physical Exam Updated Vital Signs BP (!) 95/46   Pulse (!) 121   Temp (!) 96.1 F (35.6 C)   Resp (!) 26   Ht 5\' 4"  (1.626 m)   Wt 95.3 kg (210 lb)   SpO2 97%   BMI 36.05 kg/m   Physical Exam  Constitutional: She is oriented to person, place, and time. She appears well-developed and well-nourished.  Non-toxic appearance. She does not appear ill.  HENT:  Head: Normocephalic and atraumatic.  Right Ear: External ear normal.  Left Ear: External ear normal.  Nose: Nose normal. No mucosal edema or rhinorrhea.  Mouth/Throat: Oropharynx is clear and moist and mucous membranes are normal. No  dental abscesses or uvula swelling.  Eyes: Pupils are equal, round, and reactive to light. Conjunctivae and EOM are normal.  Pupils are dilated, patient seems to have trouble focusing, she may also be seeing things that are not there.  Neck: Normal range of motion and full passive range of motion without pain. Neck supple.  Cardiovascular: Regular rhythm and normal heart sounds. Tachycardia present. Exam reveals no gallop and no friction rub.  No murmur heard. Pulmonary/Chest: Effort normal and breath sounds normal. No respiratory distress. She has no wheezes. She has no rhonchi. She has no rales. She exhibits no tenderness and no crepitus.  Abdominal: Soft. Normal appearance and bowel sounds are  normal. She exhibits no distension. There is no tenderness. There is no rebound and no guarding.  Musculoskeletal: Normal range of motion. She exhibits no edema or tenderness.  Moves all extremities well.   Neurological: She is alert and oriented to person, place, and time. She has normal strength. No cranial nerve deficit.  Skin: Skin is warm, dry and intact. No rash noted. No erythema. No pallor.  Psychiatric: She has a normal mood and affect. Her speech is normal and behavior is normal. Her mood appears not anxious.  Nursing note and vitals reviewed.    ED Treatments / Results  Labs (all labs ordered are listed, but only abnormal results are displayed) Results for orders placed or performed during the hospital encounter of 09/07/17  CBC with Differential  Result Value Ref Range   WBC 7.3 4.0 - 10.5 K/uL   RBC 4.38 3.87 - 5.11 MIL/uL   Hemoglobin 12.3 12.0 - 15.0 g/dL   HCT 37.0 36.0 - 46.0 %   MCV 84.5 78.0 - 100.0 fL   MCH 28.1 26.0 - 34.0 pg   MCHC 33.2 30.0 - 36.0 g/dL   RDW 15.5 11.5 - 15.5 %   Platelets 172 150 - 400 K/uL   Neutrophils Relative % 74 %   Neutro Abs 5.5 1.7 - 7.7 K/uL   Lymphocytes Relative 21 %   Lymphs Abs 1.5 0.7 - 4.0 K/uL   Monocytes Relative 4 %   Monocytes Absolute 0.3 0.1 - 1.0 K/uL   Eosinophils Relative 1 %   Eosinophils Absolute 0.1 0.0 - 0.7 K/uL   Basophils Relative 0 %   Basophils Absolute 0.0 0.0 - 0.1 K/uL  Ethanol  Result Value Ref Range   Alcohol, Ethyl (B) 121 (H) <10 mg/dL  Acetaminophen level  Result Value Ref Range   Acetaminophen (Tylenol), Serum 36 (H) 10 - 30 ug/mL  Salicylate level  Result Value Ref Range   Salicylate Lvl <1.6 2.8 - 30.0 mg/dL  Rapid urine drug screen (hospital performed)  Result Value Ref Range   Opiates NONE DETECTED NONE DETECTED   Cocaine NONE DETECTED NONE DETECTED   Benzodiazepines NONE DETECTED NONE DETECTED   Amphetamines NONE DETECTED NONE DETECTED   Tetrahydrocannabinol NONE DETECTED NONE  DETECTED   Barbiturates NONE DETECTED NONE DETECTED  Lipase, blood  Result Value Ref Range   Lipase 24 11 - 51 U/L  Comprehensive metabolic panel  Result Value Ref Range   Sodium 140 135 - 145 mmol/L   Potassium 3.3 (L) 3.5 - 5.1 mmol/L   Chloride 107 101 - 111 mmol/L   CO2 15 (L) 22 - 32 mmol/L   Glucose, Bld 170 (H) 65 - 99 mg/dL   BUN 13 6 - 20 mg/dL   Creatinine, Ser 0.86 0.44 - 1.00 mg/dL   Calcium 8.6 (  L) 8.9 - 10.3 mg/dL   Total Protein 8.0 6.5 - 8.1 g/dL   Albumin 4.1 3.5 - 5.0 g/dL   AST 34 15 - 41 U/L   ALT 35 14 - 54 U/L   Alkaline Phosphatase 60 38 - 126 U/L   Total Bilirubin 0.2 (L) 0.3 - 1.2 mg/dL   GFR calc non Af Amer >60 >60 mL/min   GFR calc Af Amer >60 >60 mL/min   Anion gap 18 (H) 5 - 15  Lactic acid, plasma  Result Value Ref Range   Lactic Acid, Venous 6.5 (HH) 0.5 - 1.9 mmol/L  Magnesium  Result Value Ref Range   Magnesium 1.8 1.7 - 2.4 mg/dL  Blood gas, arterial  Result Value Ref Range   FIO2 21.00    pH, Arterial 7.130 (LL) 7.350 - 7.450   pCO2 arterial 20.9 (L) 32.0 - 48.0 mmHg   pO2, Arterial 127 (H) 83.0 - 108.0 mmHg   Bicarbonate 6.9 (L) 20.0 - 28.0 mmol/L   Acid-base deficit 21.4 (H) 0.0 - 2.0 mmol/L   O2 Saturation 96.2 %   Patient temperature 95.7    Collection site LEFT RADIAL    Drawn by [id number]    Sample type ARTERIAL DRAW    Allens test (pass/fail) PASS PASS  I-Stat Beta hCG blood, ED ([facility], [facility], [facility] only)  Result Value Ref Range   I-stat hCG, quantitative <5.0 <5 mIU/mL   Comment 3           Laboratory interpretation all normal except alcohol intoxication, possible Tylenol overdose, metabolic acidosis, LA, ABG shows uncompensated metabolic acidosis    EKG EKG Interpretation  Date/Time:  Wednesday September 07 2017 05:23:59 EDT Ventricular Rate:  115 PR Interval:    QRS Duration: 99 QT Interval:  358 QTC Calculation: 496 R Axis:   85 Text Interpretation:  Sinus or ectopic atrial tachycardia Low voltage, precordial leads Prolonged  QT interval No old tracing to compare Confirmed by Rolland Porter [id number]) on 09/07/2017 6:08:30 AM Also confirmed by Rolland Porter [id number]), editor Hattie Perch [phone])  on 09/07/2017 7:10:50 AM   Radiology Dg Chest Port 1 View  Result Date: 09/07/2017 CLINICAL DATA:  Drug overdose EXAM: PORTABLE CHEST 1 VIEW COMPARISON:  September 24, 2015 FINDINGS: Lungs are clear. The heart size and pulmonary vascularity are normal. No adenopathy. No bone lesions. No pneumothorax. IMPRESSION: No edema or consolidation. Electronically Signed   By: Lowella Grip III M.D.   On: 09/07/2017 07:19    Procedures .Critical Care Performed by: Rolland Porter, MD Authorized by: Rolland Porter, MD   Critical care provider statement:    Critical care time (minutes):  42   Critical care was necessary to treat or prevent imminent or life-threatening deterioration of the following conditions:  CNS failure or compromise, circulatory failure and respiratory failure   Critical care was time spent personally by me on the following activities:  Discussions with consultants, examination of patient, obtaining history from patient or surrogate, ordering and review of laboratory studies, ordering and review of radiographic studies, pulse oximetry and re-evaluation of patient's condition   (including critical care time)  Sister also brought in a screenshot of patient's phone which showed a suicide note.     Medications Ordered in ED Medications  LORazepam (ATIVAN) 2 MG/ML injection (has no administration in time range)  ondansetron (ZOFRAN) injection 4 mg (4 mg Intravenous Not Given 09/07/17 0729)  sodium chloride 0.9 % bolus 1,000 mL (1,000 mLs Intravenous New Bag/Given 09/07/17  4332)  LORazepam (ATIVAN) injection 1 mg (has no administration in time range)  0.9 %  sodium chloride infusion (has no administration in time range)  phenylephrine (NEOSYNEPHRINE) 10-0.9 MG/250ML-% infusion (has no administration in time range)    acetylcysteine (ACETADOTE) 40 mg/mL load via infusion 14,295 mg (has no administration in time range)    Followed by  acetylcysteine (ACETADOTE) 40,000 mg in dextrose 5 % 1,000 mL (40 mg/mL) infusion (has no administration in time range)  potassium chloride 10 mEq in 100 mL IVPB (has no administration in time range)  magnesium sulfate IVPB 1 g 100 mL (has no administration in time range)  sodium bicarbonate injection 50 mEq (has no administration in time range)  sodium chloride 0.9 % bolus 1,000 mL (0 mLs Intravenous Stopped 09/07/17 0701)  LORazepam (ATIVAN) injection 1 mg (1 mg Intravenous Given 09/07/17 0600)     Initial Impression / Assessment and Plan / ED Course  I have reviewed the triage vital signs and the nursing notes.  Pertinent labs & imaging results that were available during my care of the patient were reviewed by me and considered in my medical decision making (see chart for details).   4:35 AM I talked to poison control.  They state the BuSpar is a non-benzodiazepine anxiety medication and it can cause drowsiness, dizziness, his hypotension, low heart rate, and myosis.  Metformin can cause lactic acidosis, Jardiance can cause hypotension and usually does not cause severe hypoglycemia illicits given with insulin.  Trazodone can cause drowsiness and low blood pressure and low heart rate.  She recommends EKG to look at the QTC interval.  She also recommends monitoring her potassium and magnesium because of potential of prolongation of the QTC.  The Midol has a antihistamine which explains a lot of her symptoms, the dilated pupils, other jerkiness.  She states to give plenty of benzos for tachycardia, hypertension, and for seizures.  She states gives plenty of fluids.  She states she would need at least 6 hours observation if she does well.    5:50 AM sister and father who is a Software engineer are here.  Sister states patient does drink a lot of alcohol.  She has been stressed because she  is working trying to pay off some debt related to thyroid surgery she had a couple years ago, she is a Equities trader in college, and because she has been working has not been doing well, and she has been helping her sister with her 73-month-old child.  She was at her sister's today.  She also told her sister today she had not done her taxes.  Sister states she makes comments  under her breath like "if I'm here then" however she has not said anything overt that would make them  think she would harm herself.  She states this evening she heard her "crashing around in her room".  She thought she was just drinking and she would talk to her about it in the morning.  She states however she went up and heard patient vomiting or trying to vomit and patient told her she was "trying to throw up the pills".  Sister states that these medications were all her sisters. Sister found a suicide note on patient's phone that she hadn't sent yet. She has never been admitted to psychiatric facility.  5:50 AM patient had a seizure per nursing staff.  She was given Ativan.  She was also given IV fluids.  6:15 AM I spoke to poison control again,  her random acetaminophen level is greater than 25 and since we do not know the time of ingestion they recommend Mucomyst treatment for 24 hours.  They recommend 150 mg/kg IV bolus and then 50 mg/kg/h.  They recommend repeat liver test in the morning.  06:27 AM Dr Janann Colonel, PCCM, will have someone come admit.   Recheck at 6:39 AM.  Blood pressure appears to be 67 systolic, heart rate is 332.  I updated patient's father about the starting of Mucomyst.  Also that she was being admitted to the ICU.  Patient's blood pressure was rechecked and was 67 systolic.  She was given more IV fluids.  Recheck at 7:15 AM patient's pupils are still dilated.  Her blood pressure is 96 systolic after getting IV fluids.  Her heart rate still 117.  Her ABG shows a profound metabolic acidosis.  I have a call out to  critical care again, she will may need to be on a bicarb drip.  Her lactic acid has also come back and it is elevated, this is most likely from her metformin ingestion.  07:24 AM Poison Control, called about her metabolic acidosis, most likely from her metformin. She states they generally treat with fluids, not bicarb, will talk to their toxicologist.   07:35 AM I spoke to critical care who was not taking care of patient and they preferred the team taking care of patient decide if she needs dialysis.  It looks like they are taking care of the patient, I have ordered her IV potassium for her hypokalemia.  I am going to step out her care.  Beth 07:48 AM Posion Control has talked to her toxicologist.  She recommends sending a toxic alcohol level which would probably need to go to [facility].  They also recommend starting her on Antizol for possible other toxic alcohol ingestion.  They recommend 15 mg/kg loading dose and then subsequent dose is 10 mg/kg every 12 hours x 4 doses.  They also recommend giving thiamine 100 mg IV once, pyridoxine 100 mg IV once, and no bicarb at this time.  They recommend repeating a lactic acid which should be done soon, and bmet.  They state if her lactic acid is increasing they recommend consulting nephrology for dialysis.  08:00 AM BP improved to 951 systolic, HR still 884.  Discussed with father that if her lactic acid continues to rise that they will probably be talking to him about dialysis.  Final Clinical Impressions(s) / ED Diagnoses   Final diagnoses:  Intentional drug overdose, initial encounter ([place])  Suicidal ideation  Metabolic acidosis  Seizure ([place])  Intentional acetaminophen overdose, initial encounter ([place])  Metformin overdose    Plan admission  Rolland Porter, MD, Barbette Or, MD 09/07/17 1660    Rolland Porter, MD 09/07/17 6301    Rolland Porter, MD 09/07/17 6010

## 2017-09-07 NOTE — Progress Notes (Signed)
CRITICAL VALUE ALERT  Critical Value:  Lactic acid 2.9  Date & Time Notied: 09/07/2017 1317  Provider Notified: *paged Dr. Vaughan Browner  Orders Received/Actions taken: value trending down continue to monitor

## 2017-09-07 NOTE — ED Notes (Signed)
Cynthia [name]  Stated that she performed EKG and Cath.

## 2017-09-08 ENCOUNTER — Inpatient Hospital Stay (HOSPITAL_COMMUNITY): Payer: BLUE CROSS/BLUE SHIELD

## 2017-09-08 DIAGNOSIS — Z915 Personal history of self-harm: Secondary | ICD-10-CM

## 2017-09-08 DIAGNOSIS — T43212A Poisoning by selective serotonin and norepinephrine reuptake inhibitors, intentional self-harm, initial encounter: Secondary | ICD-10-CM

## 2017-09-08 DIAGNOSIS — T43592A Poisoning by other antipsychotics and neuroleptics, intentional self-harm, initial encounter: Secondary | ICD-10-CM

## 2017-09-08 DIAGNOSIS — T383X2A Poisoning by insulin and oral hypoglycemic [antidiabetic] drugs, intentional self-harm, initial encounter: Secondary | ICD-10-CM

## 2017-09-08 DIAGNOSIS — T50902D Poisoning by unspecified drugs, medicaments and biological substances, intentional self-harm, subsequent encounter: Secondary | ICD-10-CM

## 2017-09-08 DIAGNOSIS — T5192XA Toxic effect of unspecified alcohol, intentional self-harm, initial encounter: Secondary | ICD-10-CM

## 2017-09-08 DIAGNOSIS — T391X2A Poisoning by 4-Aminophenol derivatives, intentional self-harm, initial encounter: Principal | ICD-10-CM

## 2017-09-08 DIAGNOSIS — T1491XA Suicide attempt, initial encounter: Secondary | ICD-10-CM

## 2017-09-08 DIAGNOSIS — F332 Major depressive disorder, recurrent severe without psychotic features: Secondary | ICD-10-CM

## 2017-09-08 LAB — COMPREHENSIVE METABOLIC PANEL
ALT: 32 U/L (ref 14–54)
AST: 22 U/L (ref 15–41)
Albumin: 4 g/dL (ref 3.5–5.0)
Alkaline Phosphatase: 56 U/L (ref 38–126)
Anion gap: 22 — ABNORMAL HIGH (ref 5–15)
BUN: 13 mg/dL (ref 6–20)
CO2: 15 mmol/L — ABNORMAL LOW (ref 22–32)
CREATININE: 0.98 mg/dL (ref 0.44–1.00)
Calcium: 8.1 mg/dL — ABNORMAL LOW (ref 8.9–10.3)
Chloride: 106 mmol/L (ref 101–111)
GFR calc Af Amer: >60 mL/min (ref >60)
Glucose, Bld: 160 mg/dL — ABNORMAL HIGH (ref 65–99)
Potassium: 3.2 mmol/L — ABNORMAL LOW (ref 3.5–5.1)
Sodium: 143 mmol/L (ref 135–145)
TOTAL PROTEIN: 8.1 g/dL (ref 6.5–8.1)
Total Bilirubin: 1.6 mg/dL — ABNORMAL HIGH (ref 0.3–1.2)

## 2017-09-08 LAB — HEPATIC FUNCTION PANEL
ALK PHOS: 56 U/L (ref 38–126)
ALT: 31 U/L (ref 14–54)
AST: 24 U/L (ref 15–41)
Albumin: 4 g/dL (ref 3.5–5.0)
BILIRUBIN INDIRECT: 1.5 mg/dL — AB (ref 0.3–0.9)
Bilirubin, Direct: 0.1 mg/dL (ref 0.1–0.5)
TOTAL PROTEIN: 8.1 g/dL (ref 6.5–8.1)
Total Bilirubin: 1.6 mg/dL — ABNORMAL HIGH (ref 0.3–1.2)

## 2017-09-08 LAB — GLUCOSE, CAPILLARY
GLUCOSE-CAPILLARY: 128 mg/dL — AB (ref 65–99)
GLUCOSE-CAPILLARY: 255 mg/dL — AB (ref 65–99)
GLUCOSE-CAPILLARY: 136 mg/dL — AB (ref 65–99)
Glucose-Capillary: 122 mg/dL — ABNORMAL HIGH (ref 65–99)

## 2017-09-08 LAB — BLOOD GAS, ARTERIAL
Acid-base deficit: 6.1 mmol/L — ABNORMAL HIGH (ref 0.0–2.0)
Bicarbonate: 17 mmol/L — ABNORMAL LOW (ref 20.0–28.0)
Drawn by: 232811
FIO2: 21
O2 SAT: 97.5 %
PCO2 ART: 28.6 mmHg — AB (ref 32.0–48.0)
PO2 ART: 102 mmHg (ref 83.0–108.0)
Patient temperature: 37.5
pH, Arterial: 7.396 (ref 7.350–7.450)

## 2017-09-08 LAB — CBC
HCT: 35.8 % — ABNORMAL LOW (ref 36.0–46.0)
Hemoglobin: 12 g/dL (ref 12.0–15.0)
MCH: 28.4 pg (ref 26.0–34.0)
MCHC: 33.5 g/dL (ref 30.0–36.0)
MCV: 84.8 fL (ref 78.0–100.0)
PLATELETS: 206 10*3/uL (ref 150–400)
RBC: 4.22 MIL/uL (ref 3.87–5.11)
RDW: 16 % — AB (ref 11.5–15.5)
WBC: 14.9 10*3/uL — AB (ref 4.0–10.5)

## 2017-09-08 LAB — PHOSPHORUS: Phosphorus: 3.4 mg/dL (ref 2.5–4.6)

## 2017-09-08 LAB — PROTIME-INR
INR: 1.11
PROTHROMBIN TIME: 14.3 s (ref 11.4–15.2)

## 2017-09-08 LAB — ACETAMINOPHEN LEVEL

## 2017-09-08 LAB — T4: T4, Total: 18.1 ug/dL — ABNORMAL HIGH (ref 4.5–12.0)

## 2017-09-08 LAB — HIV ANTIBODY (ROUTINE TESTING W REFLEX): HIV Screen 4th Generation wRfx: NONREACTIVE

## 2017-09-08 LAB — MAGNESIUM: Magnesium: 2.2 mg/dL (ref 1.7–2.4)

## 2017-09-08 LAB — LACTIC ACID, PLASMA: Lactic Acid, Venous: 1.5 mmol/L (ref 0.5–1.9)

## 2017-09-08 MED ORDER — LEVOTHYROXINE SODIUM 100 MCG PO TABS
200.0000 ug | ORAL_TABLET | Freq: Every day | ORAL | Status: DC
  Administered 2017-09-08 – 2017-09-09 (×2): 200 ug via ORAL
  Filled 2017-09-08 (×2): qty 2

## 2017-09-08 MED ORDER — POTASSIUM CHLORIDE CRYS ER 20 MEQ PO TBCR
40.0000 meq | EXTENDED_RELEASE_TABLET | Freq: Once | ORAL | Status: AC
  Administered 2017-09-08: 40 meq via ORAL
  Filled 2017-09-08: qty 2

## 2017-09-08 MED ORDER — FOLIC ACID 1 MG PO TABS
1.0000 mg | ORAL_TABLET | Freq: Every day | ORAL | Status: DC
  Administered 2017-09-08 – 2017-09-09 (×2): 1 mg via ORAL
  Filled 2017-09-08 (×2): qty 1

## 2017-09-08 MED ORDER — POLYETHYLENE GLYCOL 3350 17 G PO PACK
17.0000 g | PACK | Freq: Every day | ORAL | Status: DC
  Administered 2017-09-08: 17 g via ORAL
  Filled 2017-09-08: qty 1

## 2017-09-08 MED ORDER — VITAMIN B-1 100 MG PO TABS
100.0000 mg | ORAL_TABLET | Freq: Every day | ORAL | Status: DC
  Administered 2017-09-08 – 2017-09-09 (×2): 100 mg via ORAL
  Filled 2017-09-08 (×2): qty 1

## 2017-09-08 MED ORDER — INSULIN ASPART 100 UNIT/ML ~~LOC~~ SOLN
0.0000 [IU] | Freq: Three times a day (TID) | SUBCUTANEOUS | Status: DC
  Administered 2017-09-08: 8 [IU] via SUBCUTANEOUS
  Administered 2017-09-09: 2 [IU] via SUBCUTANEOUS

## 2017-09-08 NOTE — Progress Notes (Signed)
Received pt from ICU. Pt has a sitter at bedside. Pt orientated to room and policies. Pts assessment is unchanged from previous documentation. Pt denies pain at this time with no s/s of distress noted. Will continue to monitor.

## 2017-09-08 NOTE — Progress Notes (Signed)
PULMONARY / CRITICAL CARE MEDICINE   Name: Cynthia [name] MRN: [id number] DOB: [birth year]    ADMISSION DATE:  09/07/2017 CONSULTATION DATE:  09/07/2017  REFERRING MD:  Dr. 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  Took multiple medications including BuSpar, metformin, Jardiance, trazodone, and Midol.  Admitted to ICU with significant AMS, ?seizure, profound lactic acidosis. Treated with fluids, HCO3, mucomyst.      SUBJECTIVE:  Markedly improved.  Awake, alert.  No c/o.  Drinking.    VITAL SIGNS: BP (!) 158/121 (BP Location: Left Arm)   Pulse (!) 106   Temp 99.3 F (37.4 C) (Core)   Resp 19   Ht 5\' 4"  (1.626 m)   Wt 78.9 kg (173 lb 15.1 oz)   LMP  (LMP Unknown)   SpO2 99%   BMI 29.86 kg/m   INTAKE / OUTPUT: I/O last 3 completed shifts: In: 5946.1 [I.V.:4844.2; IV Piggyback:1101.9] Out: 28315 [Urine:13220]  PHYSICAL EXAMINATION: General:  Young female, NAD  Neuro: much improved.  Awake, appropriate, MAE, oriented. HEENT:  Mm moist, no JVD  Cardiovascular:  s1s2 rrr Lungs:  resps even non labored on RA, diminished bases  Abdomen:  Round, soft, +bs  Musculoskeletal:  Warm and dry, no edema   LABS:  BMET Recent Labs  Lab 09/07/17 1226 09/07/17 2106 09/08/17 0634  NA 142 144 143  K 3.8 3.5 3.2*  CL 104 105 106  CO2 19* 22 15*  BUN 12 10 13   CREATININE 0.92 0.89 0.98  GLUCOSE 174* 157* 160*    Electrolytes Recent Labs  Lab 09/07/17 0514  09/07/17 0831 09/07/17 1226 09/07/17 2106 09/08/17 0634  CALCIUM  --    < > 7.3* 7.7* 7.9* 8.1*  MG 1.8  --  1.5*  --   --  2.2  PHOS  --   --  2.5  --   --  3.4   < > = values in this interval not displayed.    CBC Recent Labs  Lab 09/07/17 0514 09/07/17 0831 09/08/17 0634  WBC 7.3 11.4* 14.9*   HGB 12.3 11.0* 12.0  HCT 37.0 33.8* 35.8*  PLT 172 187 206    Coag's Recent Labs  Lab 09/07/17 0831 09/08/17 0634  APTT 30  --   INR 1.20 1.11    Sepsis Markers Recent Labs  Lab 09/07/17 0831 09/07/17 1226 09/07/17 1411  LATICACIDVEN 7.8* 2.9* 2.8*  PROCALCITON <0.10  --   --     ABG Recent Labs  Lab 09/07/17 0844 09/07/17 1225 09/08/17 0318  PHART 7.223* 7.431 7.396  PCO2ART 24.5* 31.7* 28.6*  PO2ART 117* 103 102    Liver Enzymes Recent Labs  Lab 09/07/17 0831 09/07/17 2106 09/08/17 0634  AST 33 27 22  24   ALT 31 37 32  31  ALKPHOS 51 58 56  56  BILITOT 0.4 1.2 1.6*  1.6*  ALBUMIN 3.5 4.1 4.0  4.0    Cardiac Enzymes Recent Labs  Lab 09/07/17 0831 09/07/17 1411 09/07/17 2106  TROPONINI <0.03 <0.03 <0.03    Glucose No results for input(s): GLUCAP in the last 168 hours.  Imaging Ct Head Wo Contrast  Result Date: 09/07/2017 CLINICAL DATA:  Seizure today. Unexplained altered level of consciousness. EXAM: CT HEAD WITHOUT CONTRAST TECHNIQUE: Contiguous axial images were obtained  from the base of the skull through the vertex without intravenous contrast. COMPARISON:  None. FINDINGS: Brain: No evidence of acute infarction, hemorrhage, hydrocephalus, extra-axial collection, or mass lesion/mass effect. Vascular:  No hyperdense vessel or other acute findings. Skull: No evidence of fracture or other significant bone abnormality. Sinuses/Orbits:  No acute findings. Other: None. IMPRESSION: Negative noncontrast head CT. Electronically Signed   By: Earle Gell M.D.   On: 09/07/2017 19:17   Dg Chest Port 1 View  Result Date: 09/08/2017 CLINICAL DATA:  acute respiratory failure. EXAM: PORTABLE CHEST 1 VIEW COMPARISON:  09/07/2017. FINDINGS: Mediastinum and hilar structures normal. Mild right middle lobe and left infrahilar subsegmental atelectasis/infiltrates. No pleural effusion or pneumothorax. Heart size normal. Degenerative changes scoliosis thoracic  spine. IMPRESSION: Mild right middle lobe and left infrahilar subsegmental atelectasis/infiltrate. Electronically Signed   By: Marcello Moores  Register   On: 09/08/2017 07:07     STUDIES:  EEG 6/12>>>  This awake and drowsy EEG is abnormal due to moderate diffuse background slowing.   CULTURES:   ANTIBIOTICS:   SIGNIFICANT EVENTS:   LINES/TUBES:   DISCUSSION: 27yo female with intentional OD of multiple medications including BuSpar, metformin, Jardiance, trazodone, and Midol.  Persistent lactic acidosis, hypotension.  Tenuous respiratory status r/t AMS.   ASSESSMENT / PLAN:   Drud OD Suicide attempt  Lactic acidosis  PLAN -  F/u one more lactate   F/u CMET in am  Ok to stop mucomyst per poison control/pharmacy  Continue to hold home meds  Psych consult  Continue suicide precautions  Sitter at bedside  [place] for [place] control following    Shock - resolved.  Pressors off.  PLAN -  Monitor off pressors  Tele  KVO IVF   Hx DM  PLAN -  SSI  Carb mod diet  Holding metformin    Acute encephalopathy - in setting OD  ?seizure - EEG neg. No further seizure-like activity noted  Hx ETOH abuse  PLAN -  Sitter as above  Poison control following  Thiamine, folate  Monitor for s/s withdrawal   S/p thyroidectomy  PLAN -  Called [facility] -- will start home synthroid 294mcg daily  PCP f/u    FAMILY  - Updates:  Pt, Dad updated at bedside 6/13  - Inter-disciplinary family meet or Palliative Care meeting due by:  Day 7    Can tx to tele.  Landscape architect.  Will ask [facility] to assume care in am 6/14.    Nickolas Madrid, NP 09/08/2017  9:30 AM Pager: [phone] or [phone]

## 2017-09-08 NOTE — Progress Notes (Signed)
27 year old female admitted 6/12 with intentional overdose of multiple medications which she normally takes at home which includes metformin, Jardiance, MIDOL, Azdone and BuSpar .  Patient was treated with bicarb drip Mucomyst IV fluids.  Her labs are improving have BMP liver function tests are improving.  Follow-up the levels tomorrow.  Concern for seizure noted but with negative EEG and no recurrence.  Psych consult pending at this time patient would most likely need inpatient psych admission.TR[facility] will assume care 6/ 14/ 2019.

## 2017-09-08 NOTE — Consult Note (Signed)
[facility] Face-to-Face Psychiatry Consult   Reason for Consult:  Suicide attempt  Referring Physician:  Dr. Vaughan Browner  Patient Identification: Cynthia [name] MRN:  [id number] Principal Diagnosis: MDD (major depressive disorder), recurrent severe, without psychosis ([place]) Diagnosis:   Patient Active Problem List   Diagnosis Date Noted  . Overdose [T50.901A] 09/07/2017  . Altered mental status [R41.82]   . S/P total thyroidectomy [E89.0] 09/24/2015    Total Time spent with patient: 1 hour  Subjective:   Cynthia [name] is a 27 y.o. female patient admitted with suicide attempt by overdose.  HPI:   Per chart review, patient was admitted with suicide attempt by overdose of Buspar, Metformin Jardiance, Trazodone and Midol in the setting of alcohol use. There was a concern for seizure. EEG was negative. She received a NAC drip. UDS was negative and ethanol level was negative.   On interview, Cynthia [name] reports that she has  been depressed most of her adult life. She reports depressed mood for several months in the setting of school stressors.  She is a Equities trader at [employer].  She is an [place] major.  She is on academic probation for poor performance since the spring semester.  She reports an inability to focus and significant procrastination which required her to drop 2 classes.  She reports a history of generalized anxiety.  She took Wellbutrin in the past.  She did not feel like it was helpful.  She reports drinking prior to admission.  She has been self-medicating with alcohol.  She reports that it usually makes her feel better although this time it did not.  She has been contemplating suicide and ways to end her life.  She thought that mixing alcohol and ingesting several pills would be successful in ending her life.  She reports overdosing on Metformin, Jardiance and Midol.  She denies overdosing on Trazodone or Buspar.  She is unable to recall how much medication she ingested.  She feels happy to be  here and is regretful of her overdose.  She denies a history of suicide attempts.  She additionally endorses negative thoughts, poor sleep with initial insomnia, hopelessness, helplessness, low energy and poor motivation.  She denies HI or AVH.  She denies any history of manic symptoms (decreased need for sleep, increased energy or pressured speech).  She denies problems with appetite.  Past Psychiatric History: Depression and cutting behavior as a child.    Risk to Self:  Yes given recent suicide attempt.  Risk to Others:  None. Denies HI.  Prior Inpatient Therapy:  Denies  Prior Outpatient Therapy:  Her medications are managed by her PCP. Prior medications include Trazodone and Wellbutrin.   Past Medical History:  Past Medical History:  Diagnosis Date  . Cancer ([place])    thyroid  . Depression   . GERD (gastroesophageal reflux disease)   . Hypothyroidism     Past Surgical History:  Procedure Laterality Date  . PILONIDAL CYST DRAINAGE    . THYROIDECTOMY  09/24/2015  . THYROIDECTOMY N/A 09/24/2015   Procedure: TOTAL THYROIDECTOMY;  Surgeon: Ralene Ok, MD;  Location: [facility];  Service: General;  Laterality: N/A;   Family History: History reviewed. No pertinent family history. Family Psychiatric  History: Mother-anxiety Social History:  Social History   Substance and Sexual Activity  Alcohol Use Yes  . Alcohol/week: 3.0 oz  . Types: 5 Shots of liquor per week   Comment: white russian...     Social History   Substance and Sexual Activity  Drug Use No    Social History   Socioeconomic History  . Marital status: Single    Spouse name: Not on file  . Number of children: Not on file  . Years of education: Not on file  . Highest education level: Not on file  Occupational History  . Not on file  Social Needs  . Financial resource strain: Not on file  . Food insecurity:    Worry: Not on file    Inability: Not on file  . Transportation needs:    Medical: Not on file     Non-medical: Not on file  Tobacco Use  . Smoking status: Never Smoker  . Smokeless tobacco: Never Used  Substance and Sexual Activity  . Alcohol use: Yes    Alcohol/week: 3.0 oz    Types: 5 Shots of liquor per week    Comment: white russian...  . Drug use: No  . Sexual activity: Not on file  Lifestyle  . Physical activity:    Days per week: Not on file    Minutes per session: Not on file  . Stress: Not on file  Relationships  . Social connections:    Talks on phone: Not on file    Gets together: Not on file    Attends religious service: Not on file    Active member of club or organization: Not on file    Attends meetings of clubs or organizations: Not on file    Relationship status: Not on file  Other Topics Concern  . Not on file  Social History Narrative  . Not on file   Additional Social History: She lives with her sister. She is on academic probation from [place]. She previously worked at [employer]. She reports daily alcohol use. She drinks up to 3-4 "big glasses" of alcohol daily for the past year. She denies a history of withdrawal or DTs. She reports occasional marijuana use. She denies other illicit substance use.     Allergies:  No Known Allergies  Labs:  Results for orders placed or performed during the hospital encounter of 09/07/17 (from the past 48 hour(s))  CBC with Differential     Status: None   Collection Time: 09/07/17  5:14 AM  Result Value Ref Range   WBC 7.3 4.0 - 10.5 K/uL   RBC 4.38 3.87 - 5.11 MIL/uL   Hemoglobin 12.3 12.0 - 15.0 g/dL   HCT 37.0 36.0 - 46.0 %   MCV 84.5 78.0 - 100.0 fL   MCH 28.1 26.0 - 34.0 pg   MCHC 33.2 30.0 - 36.0 g/dL   RDW 15.5 11.5 - 15.5 %   Platelets 172 150 - 400 K/uL   Neutrophils Relative % 74 %   Neutro Abs 5.5 1.7 - 7.7 K/uL   Lymphocytes Relative 21 %   Lymphs Abs 1.5 0.7 - 4.0 K/uL   Monocytes Relative 4 %   Monocytes Absolute 0.3 0.1 - 1.0 K/uL   Eosinophils Relative 1 %   Eosinophils Absolute 0.1 0.0 -  0.7 K/uL   Basophils Relative 0 %   Basophils Absolute 0.0 0.0 - 0.1 K/uL    Comment: Performed at [facility], [place] [address], [place], [place] [address]  Ethanol     Status: Abnormal   Collection Time: 09/07/17  5:14 AM  Result Value Ref Range   Alcohol, Ethyl (B) 121 (H) <10 mg/dL    Comment: (NOTE) Lowest detectable limit for serum alcohol is 10 mg/dL. For  medical purposes only. Performed at [facility], [place] [address], [place], [place] [address]   Acetaminophen level     Status: Abnormal   Collection Time: 09/07/17  5:14 AM  Result Value Ref Range   Acetaminophen (Tylenol), Serum 36 (H) 10 - 30 ug/mL    Comment: (NOTE) Therapeutic concentrations vary significantly. A range of 10-30 ug/mL  may be an effective concentration for many patients. However, some  are best treated at concentrations outside of this range. Acetaminophen concentrations >150 ug/mL at 4 hours after ingestion  and >50 ug/mL at 12 hours after ingestion are often associated with  toxic reactions. Performed at [facility], [place] [address] [place], [place] [address]   Salicylate level     Status: None   Collection Time: 09/07/17  5:14 AM  Result Value Ref Range   Salicylate Lvl <3.5 2.8 - 30.0 mg/dL    Comment: Performed at [facility], [place] [address], [place], [place] [address]  Lipase, blood     Status: None   Collection Time: 09/07/17  5:14 AM  Result Value Ref Range   Lipase 24 11 - 51 U/L    Comment: Performed at [facility], [place] [address] [place], [place] [address]  Magnesium     Status: None   Collection Time: 09/07/17  5:14 AM  Result Value Ref Range   Magnesium 1.8 1.7 - 2.4 mg/dL    Comment: Performed at [facility], [place] [address] [place], [place] [address]  Comprehensive metabolic panel     Status: Abnormal   Collection Time: 09/07/17  5:22 AM  Result Value Ref Range    Sodium 140 135 - 145 mmol/L   Potassium 3.3 (L) 3.5 - 5.1 mmol/L   Chloride 107 101 - 111 mmol/L   CO2 15 (L) 22 - 32 mmol/L   Glucose, Bld 170 (H) 65 - 99 mg/dL   BUN 13 6 - 20 mg/dL   Creatinine, Ser 0.86 0.44 - 1.00 mg/dL   Calcium 8.6 (L) 8.9 - 10.3 mg/dL   Total Protein 8.0 6.5 - 8.1 g/dL   Albumin 4.1 3.5 - 5.0 g/dL   AST 34 15 - 41 U/L   ALT 35 14 - 54 U/L   Alkaline Phosphatase 60 38 - 126 U/L   Total Bilirubin 0.2 (L) 0.3 - 1.2 mg/dL   GFR calc non Af Amer >60 >60 mL/min   GFR calc Af Amer >60 >60 mL/min    Comment: (NOTE) The eGFR has been calculated using the CKD EPI equation. This calculation has not been validated in all clinical situations. eGFR's persistently <60 mL/min signify possible Chronic Kidney Disease.    Anion gap 18 (H) 5 - 15    Comment: Performed at [facility], [place] [address] [place],  [address]  Hemoglobin A1c     Status: Abnormal   Collection Time: 09/07/17  5:22 AM  Result Value Ref Range   Hgb A1c MFr Bld 7.6 (H) 4.8 - 5.6 %    Comment: (NOTE) Pre diabetes:          5.7%-6.4% Diabetes:              >6.4% Glycemic control for   <7.0% adults with diabetes    Mean Plasma Glucose 171.42 mg/dL    Comment: Performed at [place] [address], [place], [place] [address]  I-Stat Beta hCG blood, ED (MC, [facility], AP  only)     Status: None   Collection Time: 09/07/17  5:26 AM  Result Value Ref Range   I-stat hCG, quantitative <5.0 <5 mIU/mL   Comment 3            Comment:   GEST. AGE      CONC.  (mIU/mL)   <=1 WEEK        5 - 50     2 WEEKS       50 - 500     3 WEEKS       100 - 10,000     4 WEEKS     1,000 - 30,000        FEMALE AND NON-PREGNANT FEMALE:     LESS THAN 5 mIU/mL   Lactic acid, plasma     Status: Abnormal   Collection Time: 09/07/17  5:47 AM  Result Value Ref Range   Lactic Acid, Venous 6.5 (HH) 0.5 - 1.9 mmol/L    Comment: CRITICAL RESULT CALLED TO, READ BACK BY AND VERIFIED WITH: S.DOSTER RN  [phone] [id number] A.QUIZON Performed at [facility], [place] [address], [place], [place] [address]   Blood gas, arterial     Status: Abnormal   Collection Time: 09/07/17  6:57 AM  Result Value Ref Range   FIO2 21.00    pH, Arterial 7.130 (LL) 7.350 - 7.450    Comment: CRITICAL RESULT CALLED TO, READ BACK BY AND VERIFIED WITH: IVA KNAPP MD AT 07O4 BY AMY RAY RRT,RCP ON 09/07/2017    pCO2 arterial 20.9 (L) 32.0 - 48.0 mmHg   pO2, Arterial 127 (H) 83.0 - 108.0 mmHg   Bicarbonate 6.9 (L) 20.0 - 28.0 mmol/L   Acid-base deficit 21.4 (H) 0.0 - 2.0 mmol/L   O2 Saturation 96.2 %   Patient temperature 95.7    Collection site LEFT RADIAL    Drawn by [id number]    Sample type ARTERIAL DRAW    Allens test (pass/fail) PASS PASS    Comment: Performed at [facility], [place] [address] [place], [place] [address]  Lactic acid, plasma     Status: Abnormal   Collection Time: 09/07/17  7:03 AM  Result Value Ref Range   Lactic Acid, Venous 10.7 (HH) 0.5 - 1.9 mmol/L    Comment: CRITICAL RESULT CALLED TO, READ BACK BY AND VERIFIED WITH: Grant Fontana RN [id number] 09/07/2017 HILL K Performed at [place] [address], [place], [place] [address]   hCG, serum, qualitative     Status: None   Collection Time: 09/07/17  7:03 AM  Result Value Ref Range   Preg, Serum NEGATIVE NEGATIVE    Comment:        THE SENSITIVITY OF THIS METHODOLOGY IS >10 mIU/mL. Performed at [facility], [place] [address] [place], [place] [address]   Rapid urine drug screen (hospital performed)     Status: None   Collection Time: 09/07/17  7:11 AM  Result Value Ref Range   Opiates NONE DETECTED NONE DETECTED   Cocaine NONE DETECTED NONE DETECTED   Benzodiazepines NONE DETECTED NONE DETECTED   Amphetamines NONE DETECTED NONE DETECTED   Tetrahydrocannabinol NONE DETECTED NONE DETECTED   Barbiturates NONE DETECTED NONE DETECTED    Comment: (NOTE) DRUG SCREEN FOR MEDICAL  PURPOSES ONLY.  IF CONFIRMATION IS NEEDED FOR ANY PURPOSE, NOTIFY LAB WITHIN 5 DAYS. LOWEST DETECTABLE LIMITS FOR URINE DRUG SCREEN Drug Class  Cutoff (ng/mL) Amphetamine and metabolites    1000 Barbiturate and metabolites    200 Benzodiazepine                 300 Tricyclics and metabolites     300 Opiates and metabolites        300 Cocaine and metabolites        300 THC                            50 Performed at [facility], [place] [address] [place], [place] [address]   Urinalysis, Routine w reflex microscopic     Status: Abnormal   Collection Time: 09/07/17  8:26 AM  Result Value Ref Range   Color, Urine COLORLESS (A) YELLOW   APPearance CLEAR CLEAR   Specific Gravity, Urine 1.007 1.005 - 1.030   pH 5.0 5.0 - 8.0   Glucose, UA >=500 (A) NEGATIVE mg/dL   Hgb urine dipstick SMALL (A) NEGATIVE   Bilirubin Urine NEGATIVE NEGATIVE   Ketones, ur 80 (A) NEGATIVE mg/dL   Protein, ur NEGATIVE NEGATIVE mg/dL   Nitrite NEGATIVE NEGATIVE   Leukocytes, UA NEGATIVE NEGATIVE   RBC / HPF 0-5 0 - 5 RBC/hpf   WBC, UA 0-5 0 - 5 WBC/hpf   Bacteria, UA RARE (A) NONE SEEN   Mucus PRESENT     Comment: Performed at [facility], [place] [address], [place], [place] [address]  TSH     Status: Abnormal   Collection Time: 09/07/17  8:31 AM  Result Value Ref Range   TSH 23.447 (H) 0.350 - 4.500 uIU/mL    Comment: Performed by a 3rd Generation assay with a functional sensitivity of <=0.01 uIU/mL. Performed at [facility], [place] [address] [place], [place] [address]   T4     Status: Abnormal   Collection Time: 09/07/17  8:31 AM  Result Value Ref Range   T4, Total 18.1 (H) 4.5 - 12.0 ug/dL    Comment: (NOTE) Performed At: [facility] [place], [place] [id number] Rush Farmer MD [id number] Performed at [facility], [place] [address] [place], [place] [address]   HIV antibody  (Routine Testing)     Status: None   Collection Time: 09/07/17  8:31 AM  Result Value Ref Range   HIV Screen 4th Generation wRfx Non Reactive Non Reactive    Comment: (NOTE) Performed At: [facility] [place], [place] [id number] Rush Farmer MD [id number] Performed at [facility], [place] [address], [place], [place] [address]   Comprehensive metabolic panel     Status: Abnormal   Collection Time: 09/07/17  8:31 AM  Result Value Ref Range   Sodium 143 135 - 145 mmol/L    Comment: REPEATED TO VERIFY   Potassium 3.8 3.5 - 5.1 mmol/L   Chloride 109 101 - 111 mmol/L    Comment: REPEATED TO VERIFY   CO2 13 (L) 22 - 32 mmol/L    Comment: REPEATED TO VERIFY   Glucose, Bld 186 (H) 65 - 99 mg/dL   BUN 11 6 - 20 mg/dL   Creatinine, Ser 0.90 0.44 - 1.00 mg/dL   Calcium 7.3 (L) 8.9 - 10.3 mg/dL   Total Protein 7.0 6.5 - 8.1 g/dL   Albumin 3.5 3.5 - 5.0 g/dL   AST 33 15 - 41 U/L   ALT 31 14 - 54 U/L  Alkaline Phosphatase 51 38 - 126 U/L   Total Bilirubin 0.4 0.3 - 1.2 mg/dL   GFR calc non Af Amer >60 >60 mL/min   GFR calc Af Amer >60 >60 mL/min    Comment: (NOTE) The eGFR has been calculated using the CKD EPI equation. This calculation has not been validated in all clinical situations. eGFR's persistently <60 mL/min signify possible Chronic Kidney Disease.    Anion gap 21 (H) 5 - 15    Comment: REPEATED TO VERIFY Performed at [facility], [place] [address], [place], [place] [address]   Magnesium     Status: Abnormal   Collection Time: 09/07/17  8:31 AM  Result Value Ref Range   Magnesium 1.5 (L) 1.7 - 2.4 mg/dL    Comment: Performed at [facility], [place] [address] [place], [place] [address]  Phosphorus     Status: None   Collection Time: 09/07/17  8:31 AM  Result Value Ref Range   Phosphorus 2.5 2.5 - 4.6 mg/dL    Comment: Performed at [facility], [place] [address],  [place], [place] [address]  Amylase     Status: None   Collection Time: 09/07/17  8:31 AM  Result Value Ref Range   Amylase 28 28 - 100 U/L    Comment: Performed at [facility], [place] [address], [place], [place] [address]  Lipase, blood     Status: None   Collection Time: 09/07/17  8:31 AM  Result Value Ref Range   Lipase 28 11 - 51 U/L    Comment: Performed at [facility], [place] [address] [place] [address], [place], [place] [address]  Troponin I     Status: None   Collection Time: 09/07/17  8:31 AM  Result Value Ref Range   Troponin I <0.03 <0.03 ng/mL    Comment: Performed at [facility], [place] [address] [place], [place] [address]  Lactic acid, plasma     Status: Abnormal   Collection Time: 09/07/17  8:31 AM  Result Value Ref Range   Lactic Acid, Venous 7.8 (HH) 0.5 - 1.9 mmol/L    Comment: CRITICAL RESULT CALLED TO, READ BACK BY AND VERIFIED WITH: [id number] WILSON RN 09/07/2017 HILL K Performed at [facility], [place] [address], [place], [place] [address]   Procalcitonin     Status: None   Collection Time: 09/07/17  8:31 AM  Result Value Ref Range   Procalcitonin <0.10 ng/mL    Comment:        Interpretation: PCT (Procalcitonin) <= 0.5 ng/mL: Systemic infection (sepsis) is not likely. Local bacterial infection is possible. (NOTE)       Sepsis PCT Algorithm           Lower Respiratory Tract                                      Infection PCT Algorithm    ----------------------------     ----------------------------         PCT < 0.25 ng/mL                PCT < 0.10 ng/mL         Strongly encourage             Strongly discourage   discontinuation of antibiotics    initiation of antibiotics    ----------------------------     -----------------------------  PCT 0.25 - 0.50 ng/mL            PCT 0.10 - 0.25 ng/mL               OR       >80% decrease in PCT            Discourage initiation of                                             antibiotics      Encourage discontinuation           of antibiotics    ----------------------------     -----------------------------         PCT >= 0.50 ng/mL              PCT 0.26 - 0.50 ng/mL               AND        <80% decrease in PCT             Encourage initiation of                                             antibiotics       Encourage continuation           of antibiotics    ----------------------------     -----------------------------        PCT >= 0.50 ng/mL                  PCT > 0.50 ng/mL               AND         increase in PCT                  Strongly encourage                                      initiation of antibiotics    Strongly encourage escalation           of antibiotics                                     -----------------------------                                           PCT <= 0.25 ng/mL                                                 OR                                        > 80% decrease in PCT  Discontinue / Do not initiate                                             antibiotics Performed at [place] [address], [place], [place] [address]   Brain natriuretic peptide     Status: None   Collection Time: 09/07/17  8:31 AM  Result Value Ref Range   B Natriuretic Peptide 22.5 0.0 - 100.0 pg/mL    Comment: Performed at [facility], [place] [address] [place], [place] [address]  Cortisol     Status: None   Collection Time: 09/07/17  8:31 AM  Result Value Ref Range   Cortisol, Plasma 25.2 ug/dL    Comment: (NOTE) AM    6.7 - 22.6 ug/dL PM   <10.0       ug/dL Performed at [place] [address] [place], [place] [address]   CBC WITH DIFFERENTIAL     Status: Abnormal   Collection Time: 09/07/17  8:31 AM  Result Value Ref Range   WBC 11.4 (H) 4.0 - 10.5 K/uL   RBC 3.98 3.87 - 5.11 MIL/uL   Hemoglobin 11.0 (L) 12.0 - 15.0 g/dL   HCT 33.8 (L)  36.0 - 46.0 %   MCV 84.9 78.0 - 100.0 fL   MCH 27.6 26.0 - 34.0 pg   MCHC 32.5 30.0 - 36.0 g/dL   RDW 15.5 11.5 - 15.5 %   Platelets 187 150 - 400 K/uL   Neutrophils Relative % 87 %   Neutro Abs 9.8 (H) 1.7 - 7.7 K/uL   Lymphocytes Relative 10 %   Lymphs Abs 1.1 0.7 - 4.0 K/uL   Monocytes Relative 3 %   Monocytes Absolute 0.4 0.1 - 1.0 K/uL   Eosinophils Relative 0 %   Eosinophils Absolute 0.0 0.0 - 0.7 K/uL   Basophils Relative 0 %   Basophils Absolute 0.0 0.0 - 0.1 K/uL    Comment: Performed at [facility], [place] [address] [place], [place] [address]  Protime-INR     Status: None   Collection Time: 09/07/17  8:31 AM  Result Value Ref Range   Prothrombin Time 15.1 11.4 - 15.2 seconds   INR 1.20     Comment: Performed at [facility], [place] [address], [place], [place] [address]  APTT     Status: None   Collection Time: 09/07/17  8:31 AM  Result Value Ref Range   aPTT 30 24 - 36 seconds    Comment: Performed at [facility], [place] [address] [place], [place] [address]  Blood gas, arterial     Status: Abnormal   Collection Time: 09/07/17  8:44 AM  Result Value Ref Range   FIO2 21.00    Delivery systems ROOM AIR    pH, Arterial 7.223 (L) 7.350 - 7.450   pCO2 arterial 24.5 (L) 32.0 - 48.0 mmHg   pO2, Arterial 117 (H) 83.0 - 108.0 mmHg   Bicarbonate 9.7 (L) 20.0 - 28.0 mmol/L   Acid-base deficit 16.4 (H) 0.0 - 2.0 mmol/L   O2 Saturation 96.9 %   Patient temperature 98.6    Collection site BRACHIAL ARTERY    Drawn by [id number]    Sample type ARTERIAL     Comment: Performed at [facility], [place] [address] [place], [place]  [address]  MRSA PCR Screening     Status: None   Collection Time: 09/07/17 10:57 AM  Result Value Ref Range   MRSA by PCR NEGATIVE NEGATIVE    Comment:        The GeneXpert MRSA Assay (FDA approved for NASAL specimens only), is one component of a comprehensive MRSA  colonization surveillance program. It is not intended to diagnose MRSA infection nor to guide or monitor treatment for MRSA infections. Performed at [facility], [place] [address] [place], [place] [address]   Blood gas, arterial     Status: Abnormal   Collection Time: 09/07/17 12:25 PM  Result Value Ref Range   FIO2 21.00    Delivery systems ROOM AIR    pH, Arterial 7.431 7.350 - 7.450   pCO2 arterial 31.7 (L) 32.0 - 48.0 mmHg   pO2, Arterial 103 83.0 - 108.0 mmHg   Bicarbonate 20.7 20.0 - 28.0 mmol/L   Acid-base deficit 2.3 (H) 0.0 - 2.0 mmol/L   O2 Saturation 98.1 %   Patient temperature 98.6    Collection site LEFT RADIAL    Drawn by [id number]    Sample type ARTERIAL DRAW    Allens test (pass/fail) PASS PASS    Comment: Performed at [facility], [place] [address] [place], [place] [address]  Lactic acid, plasma     Status: Abnormal   Collection Time: 09/07/17 12:26 PM  Result Value Ref Range   Lactic Acid, Venous 2.9 (HH) 0.5 - 1.9 mmol/L    Comment: CRITICAL RESULT CALLED TO, READ BACK BY AND VERIFIED WITH: A.WILSON RN [phone] [id number] A.QUIZON Performed at [facility], [place] [address] [place], [place] [address]   Basic metabolic panel     Status: Abnormal   Collection Time: 09/07/17 12:26 PM  Result Value Ref Range   Sodium 142 135 - 145 mmol/L   Potassium 3.8 3.5 - 5.1 mmol/L   Chloride 104 101 - 111 mmol/L   CO2 19 (L) 22 - 32 mmol/L   Glucose, Bld 174 (H) 65 - 99 mg/dL   BUN 12 6 - 20 mg/dL   Creatinine, Ser 0.92 0.44 - 1.00 mg/dL   Calcium 7.7 (L) 8.9 - 10.3 mg/dL   GFR calc non Af Amer >60 >60 mL/min   GFR calc Af Amer >60 >60 mL/min    Comment: (NOTE) The eGFR has been calculated using the CKD EPI equation. This calculation has not been validated in all clinical situations. eGFR's persistently <60 mL/min signify possible Chronic Kidney Disease.    Anion gap 19 (H) 5 - 15    Comment: Performed at [facility], [place] [address], [place], [place] [address]  Ethylene glycol     Status: None   Collection Time: 09/07/17 12:26 PM  Result Value Ref Range   Ethylene Glycol Lvl None Detected None detected mg/dL    Comment: (NOTE) Stat result phoned to [facility] T on 09/07/17 at 5:35 PM                                Detection Limit = 5        This test was developed and its performance        characteristics determined by [facility]. It has        not been cleared or approved by the Food and        Drug Administration.  Performed At: [facility] [place], [place] [id number] Rush Farmer MD [id number] Performed at [facility], [place] [address] [place] [address], [place], [place] [place] [address]   Volatiles,Blood (acetone,ethanol,isoprop,methanol)     Status: None   Collection Time: 09/07/17 12:26 PM  Result Value Ref Range   Acetone, blood Negative 0.000 - 0.010 %    Comment: (NOTE)                                Detection Limit = 0.010 This test was developed and its performance characteristics determined by [facility]. It has not been cleared or approved by the Food and Drug Administration.    Ethanol, blood Negative 0.000 - 0.010 %    Comment:                                 Detection Limit = 0.010   Isopropanol, blood Negative 0.000 - 0.010 %    Comment:                                 Detection Limit = 0.010   Methanol, blood Negative 0.000 - 0.010 %    Comment: (NOTE) STAT RESULT PHONED TO LAURA B ON 09/07/17 AT 4:27 PM                                Detection Limit = 0.010 Performed At: [facility] [place], [place] [id number] Rush Farmer MD JG:[id number] Performed at [facility], [place] [address] [place], [place] [address]   Lactic acid, plasma     Status: Abnormal   Collection Time: 09/07/17  2:11 PM  Result Value Ref Range   Lactic Acid, Venous 2.8 (HH) 0.5 - 1.9 mmol/L    Comment: CRITICAL RESULT  CALLED TO, READ BACK BY AND VERIFIED WITH: Lind Covert [id number] @ [place] Performed at [place] [address], [place], [place] [address]   Troponin I     Status: None   Collection Time: 09/07/17  2:11 PM  Result Value Ref Range   Troponin I <0.03 <0.03 ng/mL    Comment: Performed at [facility], [place] [address] [place], [place] [address]  Troponin I     Status: None   Collection Time: 09/07/17  9:06 PM  Result Value Ref Range   Troponin I <0.03 <0.03 ng/mL    Comment: Performed at [facility], [place] [address], [place], [place] [address]  Comprehensive metabolic panel     Status: Abnormal   Collection Time: 09/07/17  9:06 PM  Result Value Ref Range   Sodium 144 135 - 145 mmol/L   Potassium 3.5 3.5 - 5.1 mmol/L   Chloride 105 101 - 111 mmol/L   CO2 22 22 - 32 mmol/L   Glucose, Bld 157 (H) 65 - 99 mg/dL   BUN 10 6 - 20 mg/dL   Creatinine, Ser 0.89 0.44 - 1.00 mg/dL   Calcium 7.9 (L) 8.9 - 10.3 mg/dL   Total Protein 8.4 (H) 6.5 - 8.1 g/dL   Albumin 4.1 3.5 - 5.0 g/dL   AST 27 15 - 41 U/L   ALT 37 14 - 54 U/L  Alkaline Phosphatase 58 38 - 126 U/L   Total Bilirubin 1.2 0.3 - 1.2 mg/dL   GFR calc non Af Amer >60 >60 mL/min   GFR calc Af Amer >60 >60 mL/min    Comment: (NOTE) The eGFR has been calculated using the CKD EPI equation. This calculation has not been validated in all clinical situations. eGFR's persistently <60 mL/min signify possible Chronic Kidney Disease.    Anion gap 17 (H) 5 - 15    Comment: Performed at [facility], [place] [address] [place], [place] [address]  Blood gas, arterial     Status: Abnormal   Collection Time: 09/08/17  3:18 AM  Result Value Ref Range   FIO2 21.00    Delivery systems ROOM AIR    pH, Arterial 7.396 7.350 - 7.450   pCO2 arterial 28.6 (L) 32.0 - 48.0 mmHg   pO2, Arterial 102 83.0 - 108.0 mmHg   Bicarbonate 17.0 (L) 20.0 - 28.0 mmol/L    Acid-base deficit 6.1 (H) 0.0 - 2.0 mmol/L   O2 Saturation 97.5 %   Patient temperature 37.5    Collection site LEFT RADIAL    Drawn by [id number]    Sample type ARTERIAL    Allens test (pass/fail) PASS PASS    Comment: Performed at [facility], [place] [address], [place], [place] [address]  Hepatic function panel     Status: Abnormal   Collection Time: 09/08/17  6:34 AM  Result Value Ref Range   Total Protein 8.1 6.5 - 8.1 g/dL   Albumin 4.0 3.5 - 5.0 g/dL   AST 24 15 - 41 U/L   ALT 31 14 - 54 U/L   Alkaline Phosphatase 56 38 - 126 U/L   Total Bilirubin 1.6 (H) 0.3 - 1.2 mg/dL   Bilirubin, Direct 0.1 0.1 - 0.5 mg/dL   Indirect Bilirubin 1.5 (H) 0.3 - 0.9 mg/dL    Comment: Performed at [facility], [place] [address], [place], [place] [address]  Acetaminophen level     Status: Abnormal   Collection Time: 09/08/17  6:34 AM  Result Value Ref Range   Acetaminophen (Tylenol), Serum <10 (L) 10 - 30 ug/mL    Comment: (NOTE) Therapeutic concentrations vary significantly. A range of 10-30 ug/mL  may be an effective concentration for many patients. However, some  are best treated at concentrations outside of this range. Acetaminophen concentrations >150 ug/mL at 4 hours after ingestion  and >50 ug/mL at 12 hours after ingestion are often associated with  toxic reactions. Performed at [facility], [place] [address] [place], [place] [address]   Magnesium     Status: None   Collection Time: 09/08/17  6:34 AM  Result Value Ref Range   Magnesium 2.2 1.7 - 2.4 mg/dL    Comment: Performed at [facility], [place] [address], [place], [place] [address]  Phosphorus     Status: None   Collection Time: 09/08/17  6:34 AM  Result Value Ref Range   Phosphorus 3.4 2.5 - 4.6 mg/dL    Comment: Performed at [facility], [place] [address] [place], [place] [address]  CBC     Status: Abnormal   Collection Time: 09/08/17   6:34 AM  Result Value Ref Range   WBC 14.9 (H) 4.0 - 10.5 K/uL   RBC 4.22 3.87 - 5.11 MIL/uL   Hemoglobin 12.0 12.0 - 15.0 g/dL   HCT 35.8 (L) 36.0 - 46.0 %  MCV 84.8 78.0 - 100.0 fL   MCH 28.4 26.0 - 34.0 pg   MCHC 33.5 30.0 - 36.0 g/dL   RDW 16.0 (H) 11.5 - 15.5 %   Platelets 206 150 - 400 K/uL    Comment: Performed at [facility], [place] [address] [place], [place] [address]  Comprehensive metabolic panel     Status: Abnormal   Collection Time: 09/08/17  6:34 AM  Result Value Ref Range   Sodium 143 135 - 145 mmol/L    Comment: REPEATED TO VERIFY   Potassium 3.2 (L) 3.5 - 5.1 mmol/L   Chloride 106 101 - 111 mmol/L    Comment: REPEATED TO VERIFY   CO2 15 (L) 22 - 32 mmol/L    Comment: REPEATED TO VERIFY   Glucose, Bld 160 (H) 65 - 99 mg/dL   BUN 13 6 - 20 mg/dL   Creatinine, Ser 0.98 0.44 - 1.00 mg/dL   Calcium 8.1 (L) 8.9 - 10.3 mg/dL   Total Protein 8.1 6.5 - 8.1 g/dL   Albumin 4.0 3.5 - 5.0 g/dL   AST 22 15 - 41 U/L   ALT 32 14 - 54 U/L   Alkaline Phosphatase 56 38 - 126 U/L   Total Bilirubin 1.6 (H) 0.3 - 1.2 mg/dL   GFR calc non Af Amer >60 >60 mL/min   GFR calc Af Amer >60 >60 mL/min    Comment: (NOTE) The eGFR has been calculated using the CKD EPI equation. This calculation has not been validated in all clinical situations. eGFR's persistently <60 mL/min signify possible Chronic Kidney Disease.    Anion gap 22 (H) 5 - 15    Comment: REPEATED TO VERIFY Performed at [facility], [place] [address], [place], [place] [address]   Protime-INR     Status: None   Collection Time: 09/08/17  6:34 AM  Result Value Ref Range   Prothrombin Time 14.3 11.4 - 15.2 seconds   INR 1.11     Comment: Performed at [facility], [place] [address], [place], [place] [address]  Glucose, capillary     Status: Abnormal   Collection Time: 09/08/17 10:07 AM  Result Value Ref Range   Glucose-Capillary 128 (H) 65 - 99 mg/dL   Comment 1  Notify RN    Comment 2 Document in Chart     Current Facility-Administered Medications  Medication Dose Route Frequency Provider Last Rate Last Dose  . 0.9 %  sodium chloride infusion  250 mL Intravenous PRN Mannam, Praveen, MD      . folic acid (FOLVITE) tablet 1 mg  1 mg Oral Daily Whiteheart, Kathryn A, NP   1 mg at 09/08/17 0947  . insulin aspart (novoLOG) injection 0-15 Units  0-15 Units Subcutaneous TID WC Whiteheart, Kathryn A, NP      . levothyroxine (SYNTHROID, LEVOTHROID) tablet 200 mcg  200 mcg Oral QAC breakfast Darlina Sicilian A, NP   200 mcg at 09/08/17 1006  . MEDLINE mouth rinse  15 mL Mouth Rinse BID Mannam, Praveen, MD   15 mL at 09/08/17 0951  . ondansetron (ZOFRAN) injection 4 mg  4 mg Intravenous Once Rolland Porter, MD      . thiamine (VITAMIN B-1) tablet 100 mg  100 mg Oral Daily Whiteheart, Kathryn A, NP   100 mg at 09/08/17 8786    Musculoskeletal: Strength & Muscle Tone: within normal limits Gait & Station: UTA since patient was lying in bed. Patient leans: N/A  Psychiatric Specialty Exam: Physical Exam  Nursing note and vitals reviewed. Constitutional: She is oriented to person, place, and time. She appears well-developed and well-nourished.  HENT:  Head: Normocephalic and atraumatic.  Neck: Normal range of motion.  Respiratory: Effort normal.  Musculoskeletal: Normal range of motion.  Neurological: She is alert and oriented to person, place, and time.  Skin: No rash noted.  Psychiatric: Her behavior is normal. Judgment and thought content normal. Cognition and memory are normal. She exhibits a depressed mood.    Review of Systems  Constitutional: Negative for chills and fever.  Cardiovascular: Negative for chest pain.  Gastrointestinal: Positive for constipation. Negative for abdominal pain, diarrhea, nausea and vomiting.  Psychiatric/Behavioral: Positive for depression and substance abuse. Negative for hallucinations and suicidal ideas. The patient is  nervous/anxious and has insomnia.   All other systems reviewed and are negative.   Blood pressure (!) 146/88, pulse (!) 114, temperature 99.5 F (37.5 C), resp. rate 15, height 5' 4" (1.626 m), weight 78.9 kg (173 lb 15.1 oz), SpO2 99 %.Body mass index is 29.86 kg/m.  General Appearance: Fairly Groomed, young, overweight, Caucasian female, wearing a hospital gown with greasy appearing hair who is sitting in a chair. NAD.   Eye Contact:  Good  Speech:  Clear and Coherent and Normal Rate  Volume:  Normal  Mood:  Depressed  Affect:  Congruent  Thought Process:  Goal Directed, Linear and Descriptions of Associations: Intact  Orientation:  Full (Time, Place, and Person)  Thought Content:  Logical  Suicidal Thoughts:  No  Homicidal Thoughts:  No  Memory:  Immediate;   Good Recent;   Good Remote;   Good  Judgement:  Fair  Insight:  Fair  Psychomotor Activity:  Normal  Concentration:  Concentration: Good and Attention Span: Good  Recall:  Good  Fund of Knowledge:  Good  Language:  Good  Akathisia:  No  Handed:  Right  AIMS (if indicated):   N/A  Assets:  Communication Skills Desire for Improvement Housing Social Support  ADL's:  Intact  Cognition:  WNL  Sleep:   Fair   Assessment:  Birdena MARIALENA WOLLEN is a 27 y.o. female who was admitted with suicide attempt by drug overdose of Metformin, Jardiance and Midol. She reports symptoms of depression including insomnia, poor concentration, low energy, poor motivation, feelings of hopelessness and helplessness and negative thoughts in the setting of multiple stressors. She also reports poorly managed anxiety. She warrants inpatient psychiatric hospitalization for stabilization and treatment.   Treatment Plan Summary: -Patient warrants inpatient psychiatric hospitalization given high risk of harm to self. -Continue bedside sitter.  -Continue to hold psychiatric medications given overdose.  -Please pursue involuntary commitment if patient  refuses voluntary psychiatric hospitalization or attempts to leave the hospital.  -Will sign off on patient at this time. Please consult psychiatry again as needed.     Disposition: Recommend psychiatric Inpatient admission when medically cleared.  Faythe Dingwall, DO 09/08/2017 11:03 AM

## 2017-09-09 ENCOUNTER — Encounter (HOSPITAL_COMMUNITY): Payer: Self-pay

## 2017-09-09 ENCOUNTER — Other Ambulatory Visit: Payer: Self-pay

## 2017-09-09 ENCOUNTER — Inpatient Hospital Stay (HOSPITAL_COMMUNITY)
Admission: AD | Admit: 2017-09-09 | Discharge: 2017-09-12 | DRG: 885 | Disposition: A | Discharge status: Home / Self Care | Payer: BLUE CROSS/BLUE SHIELD | Source: Intra-hospital | Attending: Psychiatry | Admitting: Psychiatry

## 2017-09-09 DIAGNOSIS — E119 Type 2 diabetes mellitus without complications: Secondary | ICD-10-CM | POA: Diagnosis present

## 2017-09-09 DIAGNOSIS — G92 Toxic encephalopathy: Secondary | ICD-10-CM | POA: Diagnosis not present

## 2017-09-09 DIAGNOSIS — R45851 Suicidal ideations: Secondary | ICD-10-CM

## 2017-09-09 DIAGNOSIS — F329 Major depressive disorder, single episode, unspecified: Secondary | ICD-10-CM | POA: Diagnosis not present

## 2017-09-09 DIAGNOSIS — E118 Type 2 diabetes mellitus with unspecified complications: Secondary | ICD-10-CM

## 2017-09-09 DIAGNOSIS — T50901A Poisoning by unspecified drugs, medicaments and biological substances, accidental (unintentional), initial encounter: Secondary | ICD-10-CM | POA: Diagnosis present

## 2017-09-09 DIAGNOSIS — R569 Unspecified convulsions: Secondary | ICD-10-CM

## 2017-09-09 DIAGNOSIS — Z85850 Personal history of malignant neoplasm of thyroid: Secondary | ICD-10-CM

## 2017-09-09 DIAGNOSIS — F332 Major depressive disorder, recurrent severe without psychotic features: Secondary | ICD-10-CM | POA: Diagnosis not present

## 2017-09-09 DIAGNOSIS — T50902A Poisoning by unspecified drugs, medicaments and biological substances, intentional self-harm, initial encounter: Secondary | ICD-10-CM | POA: Diagnosis not present

## 2017-09-09 DIAGNOSIS — Z79890 Hormone replacement therapy (postmenopausal): Secondary | ICD-10-CM | POA: Diagnosis not present

## 2017-09-09 DIAGNOSIS — R000 Tachycardia, unspecified: Secondary | ICD-10-CM | POA: Diagnosis not present

## 2017-09-09 DIAGNOSIS — E872 Acidosis, unspecified: Secondary | ICD-10-CM

## 2017-09-09 DIAGNOSIS — G929 Unspecified toxic encephalopathy: Secondary | ICD-10-CM

## 2017-09-09 DIAGNOSIS — F4000 Agoraphobia, unspecified: Secondary | ICD-10-CM | POA: Diagnosis not present

## 2017-09-09 DIAGNOSIS — G4700 Insomnia, unspecified: Secondary | ICD-10-CM | POA: Diagnosis present

## 2017-09-09 DIAGNOSIS — F1010 Alcohol abuse, uncomplicated: Secondary | ICD-10-CM | POA: Diagnosis present

## 2017-09-09 DIAGNOSIS — Z7984 Long term (current) use of oral hypoglycemic drugs: Secondary | ICD-10-CM

## 2017-09-09 DIAGNOSIS — E039 Hypothyroidism, unspecified: Secondary | ICD-10-CM | POA: Diagnosis not present

## 2017-09-09 DIAGNOSIS — E890 Postprocedural hypothyroidism: Secondary | ICD-10-CM

## 2017-09-09 DIAGNOSIS — T383X1A Poisoning by insulin and oral hypoglycemic [antidiabetic] drugs, accidental (unintentional), initial encounter: Secondary | ICD-10-CM

## 2017-09-09 DIAGNOSIS — F419 Anxiety disorder, unspecified: Secondary | ICD-10-CM | POA: Diagnosis not present

## 2017-09-09 DIAGNOSIS — E876 Hypokalemia: Secondary | ICD-10-CM

## 2017-09-09 DIAGNOSIS — Z7289 Other problems related to lifestyle: Secondary | ICD-10-CM | POA: Diagnosis not present

## 2017-09-09 DIAGNOSIS — Z79899 Other long term (current) drug therapy: Secondary | ICD-10-CM | POA: Diagnosis not present

## 2017-09-09 LAB — COMPREHENSIVE METABOLIC PANEL
ALBUMIN: 4.2 g/dL (ref 3.5–5.0)
ALT: 36 U/L (ref 14–54)
ANION GAP: 13 (ref 5–15)
AST: 31 U/L (ref 15–41)
Alkaline Phosphatase: 55 U/L (ref 38–126)
BILIRUBIN TOTAL: 1.3 mg/dL — AB (ref 0.3–1.2)
BUN: 11 mg/dL (ref 6–20)
CHLORIDE: 106 mmol/L (ref 101–111)
CO2: 20 mmol/L — ABNORMAL LOW (ref 22–32)
Calcium: 9.2 mg/dL (ref 8.9–10.3)
Creatinine, Ser: 0.78 mg/dL (ref 0.44–1.00)
GFR calc Af Amer: >60 mL/min (ref >60)
GFR calc non Af Amer: >60 mL/min (ref >60)
GLUCOSE: 126 mg/dL — AB (ref 65–99)
POTASSIUM: 3.4 mmol/L — AB (ref 3.5–5.1)
Sodium: 139 mmol/L (ref 135–145)
TOTAL PROTEIN: 8.1 g/dL (ref 6.5–8.1)

## 2017-09-09 LAB — ACETAMINOPHEN LEVEL: Acetaminophen (Tylenol), Serum: <10 ug/mL — ABNORMAL LOW (ref 10–30)

## 2017-09-09 LAB — GLUCOSE, CAPILLARY
GLUCOSE-CAPILLARY: 110 mg/dL — AB (ref 65–99)
GLUCOSE-CAPILLARY: 143 mg/dL — AB (ref 65–99)
Glucose-Capillary: 224 mg/dL — ABNORMAL HIGH (ref 65–99)

## 2017-09-09 MED ORDER — ALUM & MAG HYDROXIDE-SIMETH 200-200-20 MG/5ML PO SUSP: 30.0000 mL | ORAL | Status: DC | PRN

## 2017-09-09 MED ORDER — LORAZEPAM 1 MG PO TABS: 1.0000 mg | ORAL_TABLET | Freq: Every day | ORAL | Status: DC

## 2017-09-09 MED ORDER — LORAZEPAM 1 MG PO TABS: 1.0000 mg | ORAL_TABLET | Freq: Four times a day (QID) | ORAL | Status: DC | PRN

## 2017-09-09 MED ORDER — LORAZEPAM 1 MG PO TABS: 1.0000 mg | ORAL_TABLET | Freq: Three times a day (TID) | ORAL | Status: DC

## 2017-09-09 MED ORDER — VITAMIN B-1 100 MG PO TABS
100.0000 mg | ORAL_TABLET | Freq: Every day | ORAL | Status: DC
  Administered 2017-09-10 – 2017-09-12 (×3): 100 mg via ORAL
  Filled 2017-09-09 (×5): qty 1

## 2017-09-09 MED ORDER — ADULT MULTIVITAMIN W/MINERALS CH
1.0000 | ORAL_TABLET | Freq: Every day | ORAL | Status: DC
  Administered 2017-09-10 – 2017-09-12 (×3): 1 via ORAL
  Filled 2017-09-09 (×5): qty 1

## 2017-09-09 MED ORDER — METFORMIN HCL ER 500 MG PO TB24
500.0000 mg | ORAL_TABLET | Freq: Every day | ORAL | Status: DC
  Administered 2017-09-10 – 2017-09-12 (×3): 500 mg via ORAL
  Filled 2017-09-09 (×5): qty 1

## 2017-09-09 MED ORDER — THIAMINE HCL 100 MG/ML IJ SOLN: 100.0000 mg | Freq: Once | INTRAMUSCULAR | Status: DC

## 2017-09-09 MED ORDER — LOPERAMIDE HCL 2 MG PO CAPS: 2.0000 mg | ORAL_CAPSULE | ORAL | Status: DC | PRN

## 2017-09-09 MED ORDER — MAGNESIUM HYDROXIDE 400 MG/5ML PO SUSP: 30.0000 mL | Freq: Every day | ORAL | Status: DC | PRN

## 2017-09-09 MED ORDER — TRAZODONE HCL 50 MG PO TABS
50.0000 mg | ORAL_TABLET | Freq: Every evening | ORAL | Status: DC | PRN
  Administered 2017-09-09: 50 mg via ORAL
  Filled 2017-09-09 (×7): qty 1

## 2017-09-09 MED ORDER — HYDROXYZINE HCL 25 MG PO TABS: 25.0000 mg | ORAL_TABLET | Freq: Four times a day (QID) | ORAL | Status: DC | PRN

## 2017-09-09 MED ORDER — ACETAMINOPHEN 325 MG PO TABS: 650.0000 mg | ORAL_TABLET | Freq: Four times a day (QID) | ORAL | Status: DC | PRN

## 2017-09-09 MED ORDER — LORAZEPAM 1 MG PO TABS: 1.0000 mg | ORAL_TABLET | Freq: Four times a day (QID) | ORAL | Status: DC

## 2017-09-09 MED ORDER — POTASSIUM CHLORIDE CRYS ER 10 MEQ PO TBCR
40.0000 meq | EXTENDED_RELEASE_TABLET | ORAL | Status: AC
  Administered 2017-09-09 (×2): 40 meq via ORAL
  Filled 2017-09-09 (×2): qty 4

## 2017-09-09 MED ORDER — LEVOTHYROXINE SODIUM 100 MCG PO TABS
200.0000 ug | ORAL_TABLET | Freq: Every day | ORAL | Status: DC
  Administered 2017-09-10 – 2017-09-12 (×3): 200 ug via ORAL
  Filled 2017-09-09 (×2): qty 2
  Filled 2017-09-09: qty 1
  Filled 2017-09-09 (×2): qty 2
  Filled 2017-09-09: qty 1
  Filled 2017-09-09: qty 2

## 2017-09-09 MED ORDER — LORAZEPAM 1 MG PO TABS: 1.0000 mg | ORAL_TABLET | Freq: Two times a day (BID) | ORAL | Status: DC

## 2017-09-09 MED ORDER — INSULIN ASPART 100 UNIT/ML ~~LOC~~ SOLN
0.0000 [IU] | Freq: Three times a day (TID) | SUBCUTANEOUS | Status: DC
  Administered 2017-09-10 – 2017-09-12 (×6): 4 [IU] via SUBCUTANEOUS

## 2017-09-09 MED ORDER — ONDANSETRON 4 MG PO TBDP: 4.0000 mg | ORAL_TABLET | Freq: Four times a day (QID) | ORAL | Status: DC | PRN

## 2017-09-09 NOTE — Discharge Summary (Signed)
Physician Discharge Summary  Cynthia [name] VFI:[id number] DOB: [birth year] DOA: 09/07/2017  PCP: No primary care provider on file.  Admit date: 09/07/2017 Discharge date: 09/09/2017  Admitted From: home Disposition:  psychiatric facility        Discharge Condition:  stable   CODE STATUS:  Full code   Diet recommendation:  Carb modified, heart healthy, low sodium Consultations:  psychiatry    Discharge Diagnoses:  Principal Problem:   MDD (major depressive disorder), recurrent severe, without psychosis ([place]) Active Problems:   S/P total thyroidectomy   Overdose   DM (diabetes mellitus), type 2 ([place])   ETOH abuse   Lactic acidosis   Toxic encephalopathy      Brief Summary: Cynthia [name]  Is a 27 y/o F who was admitted with suicide attempt by overdose of Buspar, Metformin Jardiance, Trazodone and Midol in the setting of alcohol use.  Cynthia [name] that she has been depressed most of her adult life. She reports depressed mood for several months in the setting of school stressors  There was a concern for seizure in the Ed and subsequent EEG was negative. UDS was negative and ethanol level was 121.  She received a NAC drip and was admitted to the ICU team. Transferred to hospitalist service today.      Hospital Course:  Principal Problem:   MDD (major depressive disorder), recurrent severe, without psychosis ([place])   Overdose with acute toxic encephalopathy - alert oriented with normal mood now - evaluated by psych yesterday and recommended to go to inpatient psych for further treatments. Psych meds recommended to be held.  - I have spoken with psych today who continues to recommend inpatient psych admit - she is medically stable to transfer to a psych facility - cont safety sitter  ? Seizure in ED - given Ativan in ED - EEG neg- likely due to OD, ETOH in setting of Wellbutrin use  Hypotension/ Anion gap Metabolic acidosis>> Severe lactic acidosis in  setting of Metformin use - SBP in 90s- initially given fluid boluses by EMS - hypotension improved - Lactic acid was 10.7, PH was 7.13 - Fomepizole for ? Ethylene glycol or Methanol toxicity  given in ED on recommendations of poison control - further treated with fluids including Bicarb infusion   ETOH abuse - binge drinking- no withdrawal noted  DM2  - she had not picked up or started taking the Jardiance - cont Metformin - A1c is 7.6   Hypothyroid- h/o thyroid cancer s/p thyroidectomy - Synthroid  Hypokalemia - replace  Discharge Exam: Vitals:   09/09/17 0500 09/09/17 1421  BP: 126/84 121/84  Pulse: 95 (!) 102  Resp:    Temp: 98.6 F (37 C) 98.6 F (37 C)  SpO2: 98% 99%   Vitals:   09/08/17 1428 09/08/17 2018 09/09/17 0500 09/09/17 1421  BP: 133/87 140/90 126/84 121/84  Pulse: (!) 112 (!) 110 95 (!) 102  Resp: 18 18    Temp: 98.9 F (37.2 C) 98.5 F (36.9 C) 98.6 F (37 C) 98.6 F (37 C)  TempSrc: Oral  Oral Oral  SpO2: 99% 98% 98% 99%  Weight:      Height:        General: Pt is alert, awake, not in acute distress Cardiovascular: RRR, S1/S2 +, no rubs, no gallops Respiratory: CTA bilaterally, no wheezing, no rhonchi Abdominal: Soft, NT, ND, bowel sounds + Extremities: no edema, no cyanosis   Discharge Instructions  Discharge Instructions    Diet -  low sodium heart healthy   Complete by:  As directed    Diet Carb Modified   Complete by:  As directed    Increase activity slowly   Complete by:  As directed      Allergies as of 09/09/2017   No Known Allergies     Medication List    STOP taking these medications   buPROPion 300 MG 24 hr tablet Commonly known as:  WELLBUTRIN XL   JARDIANCE 25 MG Tabs tablet Generic drug:  empagliflozin   oxyCODONE 5 MG immediate release tablet Commonly known as:  Oxy IR/ROXICODONE   traZODone 50 MG tablet Commonly known as:  DESYREL     TAKE these medications   levothyroxine 200 MCG  tablet Commonly known as:  SYNTHROID, LEVOTHROID Take 200 mcg by mouth daily before breakfast.   metFORMIN 500 MG 24 hr tablet Commonly known as:  GLUCOPHAGE-XR Take 500 mg by mouth daily with breakfast.       No Known Allergies   Procedures/Studies: EEG  Ct Head Wo Contrast  Result Date: 09/07/2017 CLINICAL DATA:  Seizure today. Unexplained altered level of consciousness. EXAM: CT HEAD WITHOUT CONTRAST TECHNIQUE: Contiguous axial images were obtained from the base of the skull through the vertex without intravenous contrast. COMPARISON:  None. FINDINGS: Brain: No evidence of acute infarction, hemorrhage, hydrocephalus, extra-axial collection, or mass lesion/mass effect. Vascular:  No hyperdense vessel or other acute findings. Skull: No evidence of fracture or other significant bone abnormality. Sinuses/Orbits:  No acute findings. Other: None. IMPRESSION: Negative noncontrast head CT. Electronically Signed   By: Earle Gell M.D.   On: 09/07/2017 19:17   Dg Chest Port 1 View  Result Date: 09/08/2017 CLINICAL DATA:  acute respiratory failure. EXAM: PORTABLE CHEST 1 VIEW COMPARISON:  09/07/2017. FINDINGS: Mediastinum and hilar structures normal. Mild right middle lobe and left infrahilar subsegmental atelectasis/infiltrates. No pleural effusion or pneumothorax. Heart size normal. Degenerative changes scoliosis thoracic spine. IMPRESSION: Mild right middle lobe and left infrahilar subsegmental atelectasis/infiltrate. Electronically Signed   By: Marcello Moores  Register   On: 09/08/2017 07:07   Dg Chest Port 1 View  Result Date: 09/07/2017 CLINICAL DATA:  Drug overdose EXAM: PORTABLE CHEST 1 VIEW COMPARISON:  September 24, 2015 FINDINGS: Lungs are clear. The heart size and pulmonary vascularity are normal. No adenopathy. No bone lesions. No pneumothorax. IMPRESSION: No edema or consolidation. Electronically Signed   By: Lowella Grip III M.D.   On: 09/07/2017 07:19     The results of significant  diagnostics from this hospitalization (including imaging, microbiology, ancillary and laboratory) are listed below for reference.     Microbiology: Recent Results (from the past 240 hour(s))  MRSA PCR Screening     Status: None   Collection Time: 09/07/17 10:57 AM  Result Value Ref Range Status   MRSA by PCR NEGATIVE NEGATIVE Final    Comment:        The GeneXpert MRSA Assay (FDA approved for NASAL specimens only), is one component of a comprehensive MRSA colonization surveillance program. It is not intended to diagnose MRSA infection nor to guide or monitor treatment for MRSA infections. Performed at [facility], [place] [address] [place], [place] [address]      Labs: BNP (last 3 results) Recent Labs    09/07/17 0831  BNP 99.8   Basic Metabolic Panel: Recent Labs  Lab 09/07/17 0514  09/07/17 0831 09/07/17 1226 09/07/17 2106 09/08/17 0634 09/09/17 0624  NA  --    < >  143 142 144 143 139  K  --    < > 3.8 3.8 3.5 3.2* 3.4*  CL  --    < > 109 104 105 106 106  CO2  --    < > 13* 19* 22 15* 20*  GLUCOSE  --    < > 186* 174* 157* 160* 126*  BUN  --    < > 11 12 10 13 11   CREATININE  --    < > 0.90 0.92 0.89 0.98 0.78  CALCIUM  --    < > 7.3* 7.7* 7.9* 8.1* 9.2  MG 1.8  --  1.5*  --   --  2.2  --   PHOS  --   --  2.5  --   --  3.4  --    < > = values in this interval not displayed.   Liver Function Tests: Recent Labs  Lab 09/07/17 0522 09/07/17 0831 09/07/17 2106 09/08/17 0634 09/09/17 0624  AST 34 33 27 22  24 31   ALT 35 31 37 32  31 36  ALKPHOS 60 51 58 56  56 55  BILITOT 0.2* 0.4 1.2 1.6*  1.6* 1.3*  PROT 8.0 7.0 8.4* 8.1  8.1 8.1  ALBUMIN 4.1 3.5 4.1 4.0  4.0 4.2   Recent Labs  Lab 09/07/17 0514 09/07/17 0831  LIPASE 24 28  AMYLASE  --  28   No results for input(s): AMMONIA in the last 168 hours. CBC: Recent Labs  Lab 09/07/17 0514 09/07/17 0831 09/08/17 0634  WBC 7.3 11.4* 14.9*  NEUTROABS 5.5 9.8*  --   HGB 12.3  11.0* 12.0  HCT 37.0 33.8* 35.8*  MCV 84.5 84.9 84.8  PLT 172 187 206   Cardiac Enzymes: Recent Labs  Lab 09/07/17 0831 09/07/17 1411 09/07/17 2106  TROPONINI <0.03 <0.03 <0.03   BNP: Invalid input(s): POCBNP CBG: Recent Labs  Lab 09/08/17 1147 09/08/17 1603 09/08/17 2140 09/09/17 0745 09/09/17 1143  GLUCAP 255* 122* 136* 110* 143*   D-Dimer No results for input(s): DDIMER in the last 72 hours. Hgb A1c Recent Labs    09/07/17 0522  HGBA1C 7.6*   Lipid Profile No results for input(s): CHOL, HDL, LDLCALC, TRIG, CHOLHDL, LDLDIRECT in the last 72 hours. Thyroid function studies Recent Labs    09/07/17 0831  TSH 23.447*  T4TOTAL 18.1*   Anemia work up No results for input(s): VITAMINB12, FOLATE, FERRITIN, TIBC, IRON, RETICCTPCT in the last 72 hours. Urinalysis    Component Value Date/Time   COLORURINE COLORLESS (A) 09/07/2017 0826   APPEARANCEUR CLEAR 09/07/2017 0826   LABSPEC 1.007 09/07/2017 0826   PHURINE 5.0 09/07/2017 0826   GLUCOSEU >=500 (A) 09/07/2017 0826   HGBUR SMALL (A) 09/07/2017 0826   BILIRUBINUR NEGATIVE 09/07/2017 0826   KETONESUR 80 (A) 09/07/2017 0826   PROTEINUR NEGATIVE 09/07/2017 0826   NITRITE NEGATIVE 09/07/2017 0826   LEUKOCYTESUR NEGATIVE 09/07/2017 0826   Sepsis Labs Invalid input(s): PROCALCITONIN,  WBC,  LACTICIDVEN Microbiology Recent Results (from the past 240 hour(s))  MRSA PCR Screening     Status: None   Collection Time: 09/07/17 10:57 AM  Result Value Ref Range Status   MRSA by PCR NEGATIVE NEGATIVE Final    Comment:        The GeneXpert MRSA Assay (FDA approved for NASAL specimens only), is one component of a comprehensive MRSA colonization surveillance program. It is not intended to diagnose MRSA infection nor to guide or monitor treatment for MRSA  infections. Performed at [facility], [place] [address], [place], [place] [address]      Time coordinating discharge in minutes:  22  SIGNED:   Debbe Odea, MD  [facility] 09/09/2017, 3:00 PM Pager   If 7PM-7AM, please contact night-coverage [email or web address] Password [email or web address]

## 2017-09-09 NOTE — Progress Notes (Signed)
Pt's belongings and cell phone charger was place in pt's belongings bag and placed in the storage container for pt's belongings with pt's sticker on bag.

## 2017-09-09 NOTE — Progress Notes (Signed)
Per patient, PCP is [facility].  MD notified via text page.

## 2017-09-09 NOTE — Tx Team (Signed)
Initial Treatment Plan 09/09/2017 6:56 PM Jani A Srinivasan RAJ:[id number]    PATIENT STRESSORS: Marital or family conflict Substance abuse   PATIENT STRENGTHS: Ability for insight Average or above average intelligence Financial means General fund of knowledge   PATIENT IDENTIFIED PROBLEMS: Depression  Suicide Risk  "get a break from my family"  "talk with a psychiatrist"  "not sleeping well for a while"             DISCHARGE CRITERIA:  Improved stabilization in mood, thinking, and/or behavior Medical problems require only outpatient monitoring Verbal commitment to aftercare and medication compliance  PRELIMINARY DISCHARGE PLAN: Attend 12-step recovery group Outpatient therapy Placement in alternative living arrangements  PATIENT/FAMILY INVOLVEMENT: This treatment plan has been presented to and reviewed with the patient, Jania A [place]. The patient and family have been given the opportunity to ask questions and make suggestions.  Chauncy Passy, RN 09/09/2017, 6:56 PM

## 2017-09-09 NOTE — Progress Notes (Signed)
Patient ID: Cynthia [name], female   DOB: 05-Sep-1990, 27 y.o.   MRN: [id number]   26 year old female presents to [facility] after an assumed intentional OD on prescription medications and alcohol. Pt affect is worried and behavior anxious. Pt reports conflicting story to Probation officer than in the original MD assessment note. Pt reported to this writer that she became stressed taking care of her niece and nephew and living with her sister. Pt reports that she wants a "break from her family" and plans on moving in with her father at discharge because "he is calm like me." Pt reports drinking 3-4 beers a day while she "writes." {Pt told Probation officer that she is in school studying English, initial report says she was kicked out of school. Pt also reports intermittent THC use. Pt consistently takes her Levothyroxine but is non adherant with her Metformin and Trazodone. Pt currently denies SI and states "not right now." Pt denies HI and A/V hallucinations. Pt verbally agrees to seek staff if SI/HI or A/VH occurs and to consult with staff before acting on any harmful thoughts.  Consents signed, skin/belongings search completed and pt oriented to unit. Pt stable at this time. Pt given the opportunity to express concerns and ask questions. Pt given toiletries. Will continue to monitor.

## 2017-09-09 NOTE — Progress Notes (Addendum)
Patient has bed at [facility].  Room 403 bed 1.   Accepting MD: [place]  RN report #: [id number] Please call report Prior to patient leaving the building.   Please send original voluntary form with patient.   Patient is voluntary and will transport by [place].   Patient can transport at 5:00pm. Transportation arranged at 3:26pm for 4:45pm.  Servando Snare, Shawna Clamp [place]

## 2017-09-09 NOTE — [facility] Group Notes (Signed)
Adult Psychoeducational Group Note  Date:  09/09/2017 Time:  11:34 PM  Group Topic/Focus:  Wrap-Up Group:   The focus of this group is to help patients review their daily goal of treatment and discuss progress on daily workbooks.  Participation Level:  Minimal  Participation Quality:  Appropriate  Affect:  Appropriate and Flat  Cognitive:  Alert  Insight: Limited  Engagement in Group:  Limited  Modes of Intervention:  Discussion and Support  Additional Comments:  Pt was active in group. Pt goal for tomorrow is to attend groups and participate.   Inetta Fermo 09/09/2017, 11:34 PM

## 2017-09-09 NOTE — Progress Notes (Signed)
PROGRESS NOTE    Cynthia [name]   TKP:[id number]  DOB: 12/20/1990  DOA: 09/07/2017 PCP: No primary care provider on file.   Brief Narrative:  Cynthia [name]  Is a 27 y/o F who was admitted with suicide attempt by overdose of Buspar, Metformin Jardiance, Trazodone and Midol in the setting of alcohol use.  Cynthia [name] reports that she has  been depressed most of her adult life. She reports depressed mood for several months in the setting of school stressors  There was a concern for seizure in the Ed and subsequent EEG was negative.UDS was and ethanol level was 121.  She received a NAC drip and was admitted to the ICU team. Transferred to hospitalist service today.    Subjective: No complaints. Doing well. Asking if she can go home. Father states she has lots of family support at home.     Assessment & Plan:   Principal Problem:   MDD (major depressive disorder), recurrent severe, without psychosis (Cynthia [name])   Overdose with acute encephalopathy - alert oriented with normal mood now - evaluated by psych yesterday and recommended to go to inpatient psych for further treatments. Psych meds recommended to be held.  - I have spoken with psych today who continues to recommend inpatient psych admit - she is medically stable to transfer to a psych facility - cont safety sitter  ? Seizure on admission - EEG neg- likely due to OD  Hypotension/ Severe lactic acidosis in setting of Metformin use - Fomepizole given in ED- further treated with fluids including Bicarb infusion    ETOH abuse - binge drinking- no withdrawal noted  DM2 - cont SSI- hold Metformin- she had not yet started taking the Jardiance - A1c is 7.6   Hypothyroid- h/o thyroid cancer s/p thyroidectomy - Synthroid  Hypokalemia - replace   DVT prophylaxis: SCDs Code Status: Full code Family Communication: father Disposition Plan: psych facility Consultants:   psych Procedures:   EEG Antimicrobials:    Anti-infectives (From admission, onward)   None       Objective: Vitals:   09/08/17 1200 09/08/17 1428 09/08/17 2018 09/09/17 0500  BP: (!) 142/84 133/87 140/90 126/84  Pulse: (!) 115 (!) 112 (!) 110 95  Resp: (!) 21 18 18    Temp: 98.8 F (37.1 C) 98.9 F (37.2 C) 98.5 F (36.9 C) 98.6 F (37 C)  TempSrc: Oral Oral  Oral  SpO2: 98% 99% 98% 98%  Weight:      Height:        Intake/Output Summary (Last 24 hours) at 09/09/2017 1101 Last data filed at 09/08/2017 1617 Gross per 24 hour  Intake 600 ml  Output -  Net 600 ml   Filed Weights   09/07/17 0600 09/07/17 1229 09/08/17 0521  Weight: 95.3 kg (210 lb) 81.8 kg (180 lb 5.4 oz) 78.9 kg (173 lb 15.1 oz)    Examination: General exam: Appears comfortable  HEENT: PERRLA, oral mucosa moist, no sclera icterus or thrush Respiratory system: Clear to auscultation. Respiratory effort normal. Cardiovascular system: S1 & S2 heard, RRR.   Gastrointestinal system: Abdomen soft, non-tender, nondistended. Normal bowel sound. No organomegaly Central nervous system: Alert and oriented. No focal neurological deficits. Extremities: No cyanosis, clubbing or edema Skin: No rashes or ulcers Psychiatry:  Mood & affect appropriate.     Data Reviewed: I have personally reviewed following labs and imaging studies  CBC: Recent Labs  Lab 09/07/17 0514 09/07/17 0831 09/08/17 0634  WBC 7.3 11.4* 14.9*  NEUTROABS 5.5 9.8*  --   HGB 12.3 11.0* 12.0  HCT 37.0 33.8* 35.8*  MCV 84.5 84.9 84.8  PLT 172 187 144   Basic Metabolic Panel: Recent Labs  Lab 09/07/17 0514  09/07/17 0831 09/07/17 1226 09/07/17 2106 09/08/17 0634 09/09/17 0624  NA  --    < > 143 142 144 143 139  K  --    < > 3.8 3.8 3.5 3.2* 3.4*  CL  --    < > 109 104 105 106 106  CO2  --    < > 13* 19* 22 15* 20*  GLUCOSE  --    < > 186* 174* 157* 160* 126*  BUN  --    < > 11 12 10 13 11   CREATININE  --    < > 0.90 0.92 0.89 0.98 0.78  CALCIUM  --    < > 7.3* 7.7* 7.9*  8.1* 9.2  MG 1.8  --  1.5*  --   --  2.2  --   PHOS  --   --  2.5  --   --  3.4  --    < > = values in this interval not displayed.   GFR: Estimated Creatinine Clearance: 106.4 mL/min (by C-G formula based on SCr of 0.78 mg/dL). Liver Function Tests: Recent Labs  Lab 09/07/17 0522 09/07/17 0831 09/07/17 2106 09/08/17 0634 09/09/17 0624  AST 34 33 27 22  24 31   ALT 35 31 37 32  31 36  ALKPHOS 60 51 58 56  56 55  BILITOT 0.2* 0.4 1.2 1.6*  1.6* 1.3*  PROT 8.0 7.0 8.4* 8.1  8.1 8.1  ALBUMIN 4.1 3.5 4.1 4.0  4.0 4.2   Recent Labs  Lab 09/07/17 0514 09/07/17 0831  LIPASE 24 28  AMYLASE  --  28   No results for input(s): AMMONIA in the last 168 hours. Coagulation Profile: Recent Labs  Lab 09/07/17 0831 09/08/17 0634  INR 1.20 1.11   Cardiac Enzymes: Recent Labs  Lab 09/07/17 0831 09/07/17 1411 09/07/17 2106  TROPONINI <0.03 <0.03 <0.03   BNP (last 3 results) No results for input(s): PROBNP in the last 8760 hours. HbA1C: Recent Labs    09/07/17 0522  HGBA1C 7.6*   CBG: Recent Labs  Lab 09/08/17 1007 09/08/17 1147 09/08/17 1603 09/08/17 2140 09/09/17 0745  GLUCAP 128* 255* 122* 136* 110*   Lipid Profile: No results for input(s): CHOL, HDL, LDLCALC, TRIG, CHOLHDL, LDLDIRECT in the last 72 hours. Thyroid Function Tests: Recent Labs    09/07/17 0831  TSH 23.447*  T4TOTAL 18.1*   Anemia Panel: No results for input(s): VITAMINB12, FOLATE, FERRITIN, TIBC, IRON, RETICCTPCT in the last 72 hours. Urine analysis:    Component Value Date/Time   COLORURINE COLORLESS (A) 09/07/2017 0826   APPEARANCEUR CLEAR 09/07/2017 0826   LABSPEC 1.007 09/07/2017 0826   PHURINE 5.0 09/07/2017 0826   GLUCOSEU >=500 (A) 09/07/2017 0826   HGBUR SMALL (A) 09/07/2017 0826   BILIRUBINUR NEGATIVE 09/07/2017 0826   KETONESUR 80 (A) 09/07/2017 0826   PROTEINUR NEGATIVE 09/07/2017 0826   NITRITE NEGATIVE 09/07/2017 0826   LEUKOCYTESUR NEGATIVE 09/07/2017 0826    Sepsis Labs: @LABRCNTIP (procalcitonin:4,lacticidven:4) ) Recent Results (from the past 240 hour(s))  MRSA PCR Screening     Status: None   Collection Time: 09/07/17 10:57 AM  Result Value Ref Range Status   MRSA by PCR NEGATIVE NEGATIVE Final    Comment:  The GeneXpert MRSA Assay (FDA approved for NASAL specimens only), is one component of a comprehensive MRSA colonization surveillance program. It is not intended to diagnose MRSA infection nor to guide or monitor treatment for MRSA infections. Performed at [facility], [place] [address] [place],  [address]          Radiology Studies: Ct Head Wo Contrast  Result Date: 09/07/2017 CLINICAL DATA:  Seizure today. Unexplained altered level of consciousness. EXAM: CT HEAD WITHOUT CONTRAST TECHNIQUE: Contiguous axial images were obtained from the base of the skull through the vertex without intravenous contrast. COMPARISON:  None. FINDINGS: Brain: No evidence of acute infarction, hemorrhage, hydrocephalus, extra-axial collection, or mass lesion/mass effect. Vascular:  No hyperdense vessel or other acute findings. Skull: No evidence of fracture or other significant bone abnormality. Sinuses/Orbits:  No acute findings. Other: None. IMPRESSION: Negative noncontrast head CT. Electronically Signed   By: Earle Gell M.D.   On: 09/07/2017 19:17   Dg Chest Port 1 View  Result Date: 09/08/2017 CLINICAL DATA:  acute respiratory failure. EXAM: PORTABLE CHEST 1 VIEW COMPARISON:  09/07/2017. FINDINGS: Mediastinum and hilar structures normal. Mild right middle lobe and left infrahilar subsegmental atelectasis/infiltrates. No pleural effusion or pneumothorax. Heart size normal. Degenerative changes scoliosis thoracic spine. IMPRESSION: Mild right middle lobe and left infrahilar subsegmental atelectasis/infiltrate. Electronically Signed   By: Marcello Moores  Register   On: 09/08/2017 07:07      Scheduled Meds: . folic acid   1 mg Oral Daily  . insulin aspart  0-15 Units Subcutaneous TID WC  . levothyroxine  200 mcg Oral QAC breakfast  . mouth rinse  15 mL Mouth Rinse BID  . ondansetron (ZOFRAN) IV  4 mg Intravenous Once  . polyethylene glycol  17 g Oral Daily  . potassium chloride  40 mEq Oral Q4H  . thiamine  100 mg Oral Daily   Continuous Infusions: . sodium chloride Stopped (09/08/17 1451)     LOS: 2 days    Time spent in minutes: 35    Debbe Odea, MD [facility] Pager: [email or web address] Password [email or web address] 09/09/2017, 11:01 AM

## 2017-09-09 NOTE — Progress Notes (Signed)
Pt reports she is a new admit who came in just before the shift change.  She denies SI/HI/AVH at this time, but has entertained thoughts to end it all with the stress of going to school and helping her sister take care of her niece and nephew.  Pt is pleasant and appropriate with Probation officer.  She denies any pain or symptoms of withdrawal.  She also says that she may not needs a sleep aid as she is tired already, but she did take the Trazodone as ordered for tonight.  Support and encouragement offered.  Pt was encouraged to make her needs known to staff.  She voiced no concerns to Probation officer.  Discharge plans are in process.  Safety maintained with q15 minute checks.

## 2017-09-09 NOTE — Progress Notes (Signed)
Report called to Behavior Health.  Al questions answered.

## 2017-09-10 DIAGNOSIS — Z79899 Other long term (current) drug therapy: Secondary | ICD-10-CM

## 2017-09-10 DIAGNOSIS — E039 Hypothyroidism, unspecified: Secondary | ICD-10-CM

## 2017-09-10 DIAGNOSIS — Z79890 Hormone replacement therapy (postmenopausal): Secondary | ICD-10-CM

## 2017-09-10 DIAGNOSIS — T50902A Poisoning by unspecified drugs, medicaments and biological substances, intentional self-harm, initial encounter: Secondary | ICD-10-CM

## 2017-09-10 DIAGNOSIS — E119 Type 2 diabetes mellitus without complications: Secondary | ICD-10-CM

## 2017-09-10 DIAGNOSIS — Z7289 Other problems related to lifestyle: Secondary | ICD-10-CM

## 2017-09-10 DIAGNOSIS — Z7984 Long term (current) use of oral hypoglycemic drugs: Secondary | ICD-10-CM

## 2017-09-10 DIAGNOSIS — F329 Major depressive disorder, single episode, unspecified: Secondary | ICD-10-CM

## 2017-09-10 LAB — GLUCOSE, CAPILLARY
GLUCOSE-CAPILLARY: 193 mg/dL — AB (ref 65–99)
Glucose-Capillary: 171 mg/dL — ABNORMAL HIGH (ref 65–99)
Glucose-Capillary: 170 mg/dL — ABNORMAL HIGH (ref 65–99)

## 2017-09-10 MED ORDER — TRAZODONE HCL 50 MG PO TABS: 50.0000 mg | ORAL_TABLET | Freq: Every evening | ORAL | Status: DC | PRN

## 2017-09-10 MED ORDER — SERTRALINE HCL 50 MG PO TABS
50.0000 mg | ORAL_TABLET | Freq: Every day | ORAL | Status: DC
  Administered 2017-09-10 – 2017-09-12 (×3): 50 mg via ORAL
  Filled 2017-09-10 (×6): qty 1

## 2017-09-10 NOTE — H&P (Addendum)
Psychiatric Admission Assessment Adult  Patient Identification: Cynthia [name] MRN:  [id number] Date of Evaluation:  09/10/2017 Chief Complaint:  " I was depressed, made a bad choice " Principal Diagnosis: MDD, no psychotic features, ETOH Use Disorder  Diagnosis:   Patient Active Problem List   Diagnosis Date Noted  . DM (diabetes mellitus), type 2 ([place]) [E11.9] 09/09/2017  . ETOH abuse [F10.10] 09/09/2017  . Lactic acidosis [E87.2] 09/09/2017  . Toxic encephalopathy [G92] 09/09/2017  . MDD (major depressive disorder), recurrent episode, severe ([place]) [F33.2] 09/09/2017  . MDD (major depressive disorder), recurrent severe, without psychosis ([place]) [F33.2]   . Overdose [T50.901A] 09/07/2017  . Altered mental status [R41.82]   . S/P total thyroidectomy [E89.0] 09/24/2015   History of Present Illness: 27 year old female, presented to ED via EMS on 6/12  following medication overdose. States her recollection of overdose is limited, fragmented, states it was impulsive and unplanned. She is unsure how many tablets and of what medications she took, but states it was likely Midol, possibly Metformin. She required initial admission to medical inpatient unit for stabilization.  She states she was intoxicated with alcohol at the time, and states " honestly I don't think this would have happened if I was not drinking ". Admission BAL 121, admission UDS negative. States she has history of depression and recently had been feeling more anxious, depressed , but states she did not have any suicidal plans or intentions  prior to event . She does endorse neuro-vegetative symptoms of depression as below.  Denies psychotic symptoms. She reports drinking 3-4 times a week , usually 4 to 5 drinks per sitting. Endorse some stressors, and describes some financial and college related stressors. Reports she is in college but withdrew last semester due to academic and financial issues. States " I am just not where I  would like to be at my age ".  Associated Signs/Symptoms: Depression Symptoms:  depressed mood, anhedonia, insomnia, suicidal attempt, anxiety, loss of energy/fatigue, (Hypo) Manic Symptoms:  Denies  Anxiety Symptoms:  Reports increased anxiety, worry, and feels she worries excessively. Denies panic attacks Psychotic Symptoms:  Denies  PTSD Symptoms: Does not endorse  Total Time spent with patient: 45 minutes  Past Psychiatric History: no prior psychiatric admissions, no prior suicide attempts, history of self cutting in the past ( isolated , denies any pattern of self injurious behaviors), denies history of psychosis, denies panic attacks, but does describe mild agoraphobia.Reports she feels she worries excessively about even minor issues. Reports history of depression which started as a teenager, which she states waxes and wanes . Denies history of mania. Denies history of PTSD. Denies any history of violence. Denies history of Eating Disorder .   Is the patient at risk to self? Yes.    Has the patient been a risk to self in the past 6 months? Yes.    Has the patient been a risk to self within the distant past? No.  Is the patient a risk to others? No.  Has the patient been a risk to others in the past 6 months? No.  Has the patient been a risk to others within the distant past? No.   Prior Inpatient Therapy:  as above  Prior Outpatient Therapy:  does not currently have an outpatient psychiatrist or therapist . Medications are prescribed by her PCP.  Alcohol Screening: 1. How often do you have a drink containing alcohol?: 4 or more times a week 2. How many drinks containing alcohol  do you have on a typical day when you are drinking?: 3 or 4 3. How often do you have six or more drinks on one occasion?: Weekly AUDIT-C Score: 8 4. How often during the last year have you found that you were not able to stop drinking once you had started?: Less than monthly 5. How often during the  last year have you failed to do what was normally expected from you becasue of drinking?: Never 6. How often during the last year have you needed a first drink in the morning to get yourself going after a heavy drinking session?: Less than monthly 7. How often during the last year have you had a feeling of guilt of remorse after drinking?: Daily or almost daily 8. How often during the last year have you been unable to remember what happened the night before because you had been drinking?: Less than monthly 9. Have you or someone else been injured as a result of your drinking?: No 10. Has a relative or friend or a doctor or another health worker been concerned about your drinking or suggested you cut down?: Yes, but not in the last year Alcohol Use Disorder Identification Test Final Score (AUDIT): 17 Intervention/Follow-up: Alcohol Education Substance Abuse History in the last 12 months:  Drinks 3-4 times a week , up to 4 drinks per sitting. Answers 2/4 CAGE questions affirmatively ( C, E) . Denies drug abuse, smokes cannabis occasionally  Consequences of Substance Abuse: Denies history of seizures, no history of DUIs, no history of severe WDL, history of occasional blackouts. Previous Psychotropic Medications: in the past had been treated with Buproprion, took for a " few months", stopped 2 months ago. No other psychiatric medications Psychological Evaluations:  No  Past Medical History: history of thyroid cancer, thyroidectomy in 2017. History of DM Past Medical History:  Diagnosis Date  . Cancer ([place])    thyroid  . Depression   . GERD (gastroesophageal reflux disease)   . Hypothyroidism     Past Surgical History:  Procedure Laterality Date  . PILONIDAL CYST DRAINAGE    . THYROIDECTOMY  09/24/2015  . THYROIDECTOMY N/A 09/24/2015   Procedure: TOTAL THYROIDECTOMY;  Surgeon: Ralene Ok, MD;  Location: [facility] OR;  Service: General;  Laterality: N/A;   Family History: mother died 3 years  ago from MI , father alive . Has one older brother and one older sister, lives with sister  Family Psychiatric  History: reports mother had history of alcohol abuse, depression, anxiety. A great aunt committed suicide.  Tobacco Screening: Have you used any form of tobacco in the last 30 days? (Cigarettes, Smokeless Tobacco, Cigars, and/or Pipes): No Social History: 27 year old single female, no children, lives with her sister , currently unemployed ( reports she recently quit her job one month ago), states she has been helping sister with babysitting. Currently in college , but currently taking a semester off due to academic and financial /tuition issues. Social History   Substance and Sexual Activity  Alcohol Use Yes  . Alcohol/week: 2.4 oz  . Types: 4 Cans of beer per week     Social History   Substance and Sexual Activity  Drug Use Yes  . Types: Marijuana    Additional Social History: Marital status: Single Are you sexually active?: Yes What is your sexual orientation?: Heterosexual Has your sexual activity been affected by drugs, alcohol, medication, or emotional stress?: N/A Does patient have children?: No    Pain Medications: denies Prescriptions:  metformin and levothyroxine reported to RN Over the Counter: denies History of alcohol / drug use?: Yes Longest period of sobriety (when/how long): none Negative Consequences of Use: Personal relationships Withdrawal Symptoms: (denies) Name of Substance 1: alcohol 1 - Age of First Use: unknown 1 - Amount (size/oz): 3-4 beers 1 - Frequency: per day 1 - Duration: for a while 1 - Last Use / Amount: day ago Name of Substance 2: THC 2 - Age of First Use: [age] yoa 2 - Amount (size/oz): a bowl 2 - Frequency: socially 2 - Duration: every now and then 2 - Last Use / Amount: 07/2017                Allergies:  No Known Allergies Lab Results:  Results for orders placed or performed during the hospital encounter of 09/09/17  (from the past 48 hour(s))  Glucose, capillary     Status: Abnormal   Collection Time: 09/09/17  9:09 PM  Result Value Ref Range   Glucose-Capillary 224 (H) 65 - 99 mg/dL  Glucose, capillary     Status: Abnormal   Collection Time: 09/10/17  6:24 AM  Result Value Ref Range   Glucose-Capillary 193 (H) 65 - 99 mg/dL    Blood Alcohol level:  Lab Results  Component Value Date   ETH 121 (H) 09/81/1914    Metabolic Disorder Labs:  Lab Results  Component Value Date   HGBA1C 7.6 (H) 09/07/2017   MPG 171.42 09/07/2017   No results found for: PROLACTIN No results found for: CHOL, TRIG, HDL, CHOLHDL, VLDL, LDLCALC  Current Medications: Current Facility-Administered Medications  Medication Dose Route Frequency Provider Last Rate Last Dose  . acetaminophen (TYLENOL) tablet 650 mg  650 mg Oral Q6H PRN Laverle Hobby, PA-C      . alum & mag hydroxide-simeth (MAALOX/MYLANTA) 200-200-20 MG/5ML suspension 30 mL  30 mL Oral Q4H PRN Laverle Hobby, PA-C      . hydrOXYzine (ATARAX/VISTARIL) tablet 25 mg  25 mg Oral Q6H PRN Patriciaann Clan E, PA-C      . insulin aspart (novoLOG) injection 0-20 Units  0-20 Units Subcutaneous TID WC Patriciaann Clan E, PA-C   4 Units at 09/10/17 7829  . levothyroxine (SYNTHROID, LEVOTHROID) tablet 200 mcg  200 mcg Oral QAC breakfast Laverle Hobby, PA-C   200 mcg at 09/10/17 5621  . loperamide (IMODIUM) capsule 2-4 mg  2-4 mg Oral PRN Laverle Hobby, PA-C      . LORazepam (ATIVAN) tablet 1 mg  1 mg Oral Q6H PRN Laverle Hobby, PA-C      . LORazepam (ATIVAN) tablet 1 mg  1 mg Oral QID Patriciaann Clan E, PA-C       Followed by  . [START ON 09/11/2017] LORazepam (ATIVAN) tablet 1 mg  1 mg Oral TID Laverle Hobby, PA-C       Followed by  . [START ON 09/12/2017] LORazepam (ATIVAN) tablet 1 mg  1 mg Oral BID Patriciaann Clan E, PA-C       Followed by  . [START ON 09/14/2017] LORazepam (ATIVAN) tablet 1 mg  1 mg Oral Daily Simon, Spencer E, PA-C      . magnesium  hydroxide (MILK OF MAGNESIA) suspension 30 mL  30 mL Oral Daily PRN Laverle Hobby, PA-C      . metFORMIN (GLUCOPHAGE-XR) 24 hr tablet 500 mg  500 mg Oral Q breakfast Simon, Spencer E, PA-C      . multivitamin with minerals tablet  1 tablet  1 tablet Oral Daily Laverle Hobby, PA-C      . ondansetron (ZOFRAN-ODT) disintegrating tablet 4 mg  4 mg Oral Q6H PRN Patriciaann Clan E, PA-C      . thiamine (B-1) injection 100 mg  100 mg Intramuscular Once Patriciaann Clan E, PA-C      . thiamine (VITAMIN B-1) tablet 100 mg  100 mg Oral Daily Simon, Spencer E, PA-C      . traZODone (DESYREL) tablet 50 mg  50 mg Oral QHS,MR X 1 Laverle Hobby, PA-C   50 mg at 09/09/17 2158   PTA Medications: Medications Prior to Admission  Medication Sig Dispense Refill Last Dose  . levothyroxine (SYNTHROID, LEVOTHROID) 200 MCG tablet Take 200 mcg by mouth daily before breakfast.   unknown  . metFORMIN (GLUCOPHAGE-XR) 500 MG 24 hr tablet Take 500 mg by mouth daily with breakfast.   Past Week at Unknown time    Musculoskeletal: Strength & Muscle Tone: within normal limits Gait & Station: normal Patient leans: N/A  Psychiatric Specialty Exam: Physical Exam  Review of Systems  Constitutional: Negative.   HENT: Negative.   Eyes: Negative.   Respiratory: Negative.   Cardiovascular: Negative.   Gastrointestinal: Negative.   Genitourinary: Negative.   Musculoskeletal: Negative.   Skin: Negative.   Neurological: Negative for seizures and headaches.  Endo/Heme/Allergies: Negative.   Psychiatric/Behavioral: Positive for depression, substance abuse and suicidal ideas.  All other systems reviewed and are negative.   Blood pressure (!) 131/93, pulse (!) 119, temperature 98.2 F (36.8 C), temperature source Oral, resp. rate 20, height 5\' 4"  (1.626 m), weight 78 kg (172 lb), SpO2 100 %.Body mass index is 29.52 kg/m.  General Appearance: Fairly Groomed  Eye Contact:  Fair  Speech:  Normal Rate  Volume:  Normal   Mood:  reports mood as depressed, describes as 4/10  Affect:  mildly constricted, anxious, but does smile at times appropriately  Thought Process:  Linear and Descriptions of Associations: Intact  Orientation:  Full (Time, Place, and Person)  Thought Content:  no hallucinations, no delusions, not internally preoccupied   Suicidal Thoughts:  No denies any suicidal or self injurious ideations, denies any homicidal or violent ideations  Homicidal Thoughts:  No  Memory:  recent and remote grossly intact   Judgement:  Fair  Insight:  Fair  Psychomotor Activity:  Normal- no current tremors, no diaphoresis, no psychomotor restlessness or agitation  Concentration:  Concentration: Good and Attention Span: Good  Recall:  Good  Fund of Knowledge:  Good  Language:  Good  Akathisia:  Negative  Handed:  Right  AIMS (if indicated):     Assets:  Communication Skills Desire for Improvement Resilience  ADL's:  Intact  Cognition:  WNL  Sleep:       Treatment Plan Summary: Daily contact with patient to assess and evaluate symptoms and progress in treatment, Medication management, Plan inpatient treatment  and medications as below  Observation Level/Precautions:  15 minute checks  Laboratory:  as needed   Psychotherapy:  [place], group therapy   Medications:   Patient agrees to antidepressant trial, start Zoloft 50 mgrs QDAY for depression. On Ativan detox protocol to minimize risk of alcohol withdrawal. Will change this to PRN rather than standing detox as last ETOH consumption lat 6/11 and no current symptoms of WDL. Continue Synthroid for hypothyroidism. Continue Glucophage for diabetes management  Consultations: as needed     Discharge Concerns:  -  Estimated LOS: 5  days   Other:     Physician Treatment Plan for Primary Diagnosis:  MDD, no psychotic features Long Term Goal(s): Improvement in symptoms so as ready for discharge  Short Term Goals: Ability to identify changes in lifestyle to  reduce recurrence of condition will improve and Ability to maintain clinical measurements within normal limits will improve  Physician Treatment Plan for Secondary Diagnosis: Alcohol Use Disorder  Long Term Goal(s): Improvement in symptoms so as ready for discharge  Short Term Goals: Ability to identify triggers associated with substance abuse/mental health issues will improve  I certify that inpatient services furnished can reasonably be expected to improve the patient's condition.    Jenne Campus, MD 6/15/201911:00 AM

## 2017-09-10 NOTE — BHH Group Notes (Signed)
Life SKills   Date:  09/10/2017  Time:  4:27 PM  Type of Therapy:  Nurse Education  /  Identifying Needs :  The group focuses on teaching patients how to identify their needs and athen hwo to develop the coping skills needed to get some of those needs met.  Participation Level:  Active  Participation Quality:  Appropriate  Affect:  Appropriate  Cognitive:  Alert  Insight:  Improving  Engagement in Group:  Engaged  Modes of Intervention:  Education  Summary of Progress/Problems:  [name], Cynthia Lynn 09/10/2017, 4:27 PM 

## 2017-09-10 NOTE — Plan of Care (Signed)
  Problem: Education: Goal: Emotional status will improve Outcome: Progressing   

## 2017-09-10 NOTE — Progress Notes (Signed)
The focus of this group is to help patients review their daily goal of treatment and discuss progress on daily workbooks. Pt attended the evening group session and responded to all discussion prompts from the [place]. Pt shared that today was a good day on the unit, the highlight of which was connecting with the material taught in the daytime groups about needs and self-care.  Pt discussed how she planned on using new coping skills and medication treatment to maintain her wellness upon discharge. "I feel good about my future when I leave here, like I'm going to get my life back in balance again."  Pt requested a pillow and blanket from the Writer, which were given to her following wrap-up. Pt's affect was appropriate.

## 2017-09-10 NOTE — BHH Group Notes (Signed)
LCSW Group Therapy Note  09/10/2017   2:30-3:15 pm  Type of Therapy and Topic:  Group Therapy: Anger Cues and Responses  Participation Level:  Active   Description of Group:   In this group, patients learned how to recognize the physical, cognitive, emotional, and behavioral responses they have to anger-provoking situations.  They identified a recent time they became angry and how they reacted.  They analyzed how their reaction was possibly beneficial and how it was possibly unhelpful.  The group discussed a variety of healthier coping skills that could help with such a situation in the future.  Deep breathing was practiced briefly.  Therapeutic Goals: 1. Patients will remember their last incident of anger and how they felt emotionally and physically, what their thoughts were at the time, and how they behaved. 2. Patients will identify how their behavior at that time worked for them, as well as how it worked against them. 3. Patients will explore possible new behaviors to use in future anger situations. 4. Patients will learn that anger itself is normal and cannot be eliminated, and that healthier reactions can assist with resolving conflict rather than worsening situations.  Summary of Patient Progress:  The patient shared that h most recent time of anger was during a party hosted by family members. She denied becoming hostile or confrontational but stated she typically retreats to place by herself. She is able to recognize the physical and emotional cues that are associated with anger as well can articulate that anger is normal and natural human emotion.  Therapeutic Modalities:   Cognitive Behavioral Therapy  Elisabeth Pigeon, LCSW  Rolanda Jay

## 2017-09-10 NOTE — BHH Suicide Risk Assessment (Signed)
[facility] Admission Suicide Risk Assessment   Nursing information obtained from:  Patient Demographic factors:  Caucasian Current Mental Status:  Suicidal ideation indicated by patient, Intention to act on plan to harm others Loss Factors:  Decrease in vocational status Historical Factors:  Family history of mental illness or substance abuse Risk Reduction Factors:  Positive social support, Sense of responsibility to family, Living with another person, especially a relative  Total Time spent with patient: 45 minutes Principal Problem:  S/P Suicide Attempt, MDD,  Alcohol Use Disorder  Diagnosis:   Patient Active Problem List   Diagnosis Date Noted  . DM (diabetes mellitus), type 2 ([place]) [E11.9] 09/09/2017  . ETOH abuse [F10.10] 09/09/2017  . Lactic acidosis [E87.2] 09/09/2017  . Toxic encephalopathy [G92] 09/09/2017  . MDD (major depressive disorder), recurrent episode, severe ([place]) [F33.2] 09/09/2017  . MDD (major depressive disorder), recurrent severe, without psychosis ([place]) [F33.2]   . Overdose [T50.901A] 09/07/2017  . Altered mental status [R41.82]   . S/P total thyroidectomy [E89.0] 09/24/2015   Subjective Data:   Continued Clinical Symptoms:  Alcohol Use Disorder Identification Test Final Score (AUDIT): 17 The "Alcohol Use Disorders Identification Test", Guidelines for Use in Primary Care, Second Edition.  World Pharmacologist [facility]). Score between 0-7:  no or low risk or alcohol related problems. Score between 8-15:  moderate risk of alcohol related problems. Score between 16-19:  high risk of alcohol related problems. Score 20 or above:  warrants further diagnostic evaluation for alcohol dependence and treatment.   CLINICAL FACTORS:  27 year old female, presented to ED 6/12  following overdose on prescribed medication. She reports depression and neuro-vegetative symptoms of depression. She has been drinking several times a week, up to 5 drinks per episode, and admission BAL  was 121    Psychiatric Specialty Exam: Physical Exam  ROS  Blood pressure (!) 131/93, pulse (!) 119, temperature 98.2 F (36.8 C), temperature source Oral, resp. rate 20, height 5\' 4"  (1.626 m), weight 78 kg (172 lb), SpO2 100 %.Body mass index is 29.52 kg/m.  See admit MSE                                                        COGNITIVE FEATURES THAT CONTRIBUTE TO RISK:  Closed-mindedness and Loss of executive function    SUICIDE RISK:   Mild:  Suicidal ideation of limited frequency, intensity, duration, and specificity.  There are no identifiable plans, no associated intent, mild dysphoria and related symptoms, good self-control (both objective and subjective assessment), few other risk factors, and identifiable protective factors, including available and accessible social support.  PLAN OF CARE: Patient will be admitted to inpatient psychiatric unit for stabilization and safety. Will provide and encourage milieu participation. Provide medication management and maked adjustments as needed.  Will follow daily.    I certify that inpatient services furnished can reasonably be expected to improve the patient's condition.   Jenne Campus, MD 09/10/2017, 11:40 AM

## 2017-09-10 NOTE — BHH Counselor (Signed)
Adult Comprehensive Assessment  Patient ID: Cynthia [name], female   DOB: 10/13/1990, 27 y.o.   MRN: [id number]  Information Source: Information source: Patient  Current Stressors:  Patient states their primary concerns and needs for treatment are:: Depression and anxiety, each feeding into each other Patient states their goals for this hospitilization and ongoing recovery are:: "Mostly I'm hoping to get an appointment to follow up with one-on-one therapy once or twice a week, because I'm not great at groups.  Also follow up with my primary care physician more often." Educational / Learning stressors: Getting close to wrapping up Bachelor's degree, a little depressing that she is taking longer than siblings. 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 Family Relationships: Sibling stuff, "usual."  Mother passed away and sister is 107 years older, more like a mother. Financial / Lack of resources (include bankruptcy): Father is going to try to help, but she does wish she had more saved up by now. Housing / Lack of housing: Denies stressors - living with family can be a little stressful. Physical health (include injuries & life threatening diseases): Diabetes, trying to lose weight and keep her diabetes under control.  Had thyroid cancer 2 years ago, so had to have it removed. Social relationships: Anxiety can affect her ability to have a friend group. Substance abuse: Trying to cut back on her drinking, since it is a depressant. Bereavement / Loss: Mother died 2 years ago, heart attack suddenly, very difficult.  Living/Environment/Situation:  Living Arrangements: Other relatives Living conditions (as described by patient or guardian): Good, "really nice" Who else lives in the home?: Sister, brother-in-law, 32yo nephew, 12mo  niece How long has patient lived in current situation?: 2 years What is atmosphere in current home: Loving, Supportive, Comfortable, Other (Comment)("the usual clashes")  Family History:  Marital status: Single Are you sexually active?: Yes What is your sexual orientation?: Heterosexual Has your sexual activity been affected by drugs, alcohol, medication, or emotional stress?: N/A Does patient have children?: No  Childhood History:  By whom was/is the patient raised?: Mother, Father Additional childhood history information: Parents divorced when she was 83yo, then she had little contact with father for awhile. Description of patient's relationship with caregiver when they were a child: Was homeschooled, but it was not sufficiently guided, left on her own a lot.  Mother - very lenient, good mother; Father - more passive, a little nasty when he remarried and they were not on good terms Patient's description of current relationship with people who raised him/her: Mother - deceased 2 years ago; Father - good, mending relationship, going to move in with him soon, as he moving back to [place] How were you disciplined when you got in trouble as a child/adolescent?: Spanking, threatening Does patient have siblings?: Yes Number of Siblings: 2 Description of patient's current relationship with siblings: Older brother and older sister - "best friends" with brother, sister is a second mom/best friend Did patient suffer any verbal/emotional/physical/sexual abuse as a child?: No Did patient suffer from severe childhood neglect?: Yes Patient description of severe childhood neglect: Not severe neglect, but her mother did "homeschool" her and let her "go my own way," without supervision, so it was somewhat chaotic and disorganized.  Also moved a lot very suddenly from one state to another. 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 What is the longest time patient has a held a job?: 1-1/2 years Where was the patient employed at that time?: [employer] Did You Receive Any Psychiatric Treatment/Services While in the [employer]?: No Are There Guns or Other Weapons in [place]?: Yes Types of Guns/Weapons: 2 handguns Are These Weapons Safely Secured?: Yes  Financial Resources:   Financial resources: Income from employment, Private insurance(BCBS) Does patient have a representative payee or guardian?: No  Alcohol/Substance Abuse:   What has been your use of drugs/alcohol within the last 12 months?: Alcohol about 3 days a week; occasionallly marijuana If attempted suicide, did drugs/alcohol play a role in this?: Yes Alcohol/Substance Abuse Treatment Hx: Denies past history Has alcohol/substance abuse ever caused legal problems?: No  Social Support System:   Pensions consultant Support System: Good Describe Community Support System: Father, sister, brother, brother-in-law Type of faith/religion: Atheist How does patient's faith help to cope with current illness?: N/A  Leisure/Recreation:   Leisure and Hobbies: Read, write, crafts, painting, going out into nature  Strengths/Needs:   What is the patient's perception of their strengths?: Smart, good Probation officer,  articulate, nice person Patient states they can use these personal strengths during their treatment to contribute to their recovery: "It would be good to keep a journal for myself and to repeat back to a therapist.  Being artistic, funnel some of my feelings into that." Patient states these barriers may affect/interfere with their treatment: "Just myself and my own stubborness."  Patient states these barriers may affect their return to the community: None Other important information patient would like considered in planning for their treatment: None  Discharge Plan:   Currently receiving community mental health services: Yes (From Whom)(Primary care physician [facility]) Patient states concerns and preferences for aftercare planning are: Would like to get a therapist and a psychiatrist, not go to just Primary care physician. Patient states they will know when they are safe and ready for discharge when: "I feel like I'm ready now.  What brought me in was a lapse.  I'm looking forward to finding a 1:1 therapist.  I have signed a 72-hour discharge notice." Does patient have access to transportation?: Yes Does patient have financial barriers related to discharge medications?: No Patient description of barriers related to discharge medications: No barriers Will patient be returning to same living situation after discharge?: Yes  Summary/Recommendations:   Summary and Recommendations (to be completed by the evaluator): Patient is a 27yo female admitted voluntarily with a suicide attempt by overdose on Buspar, Metformin, Jardiance, Trazodone and Midol.  Primary stressors include multiple medical issues including diabetes and loss of thyroid to cancer, mother's sudden death 2 years ago, school pressure and wanting to finish  her undergraduate degree, and psychiatric medication not working.  She feels she is drinking too much and wants to stop.  She has been seeing her Administrator, sports but would like to see a psychiatrist for medicines and a therapist weekly.  Patient will benefit from crisis stabilization, medication evaluation, group therapy and psychoeducation, in addition to case management for discharge planning. At discharge it is recommended that Patient adhere to the established discharge plan and continue in treatment.  Maretta Los. 09/10/2017

## 2017-09-10 NOTE — Progress Notes (Signed)
D Pt is observed sitting quietly in the dayroom. She is nervous, pale and  Anxious. She responds politely when Probation officer approaches her and she says " I don't know what to expect".      A She completed her daily assessment and on this she wrote she deneid SI today and she rated her depression, hopelessness and anxiety " 3/0/5", respectively. She is quite qttentive in [employer] group today.     R Safety si in place and poc includes continuing to foster therapeutic  relationship.

## 2017-09-11 DIAGNOSIS — F419 Anxiety disorder, unspecified: Secondary | ICD-10-CM

## 2017-09-11 DIAGNOSIS — R000 Tachycardia, unspecified: Secondary | ICD-10-CM

## 2017-09-11 LAB — GLUCOSE, CAPILLARY
GLUCOSE-CAPILLARY: 151 mg/dL — AB (ref 65–99)
Glucose-Capillary: 164 mg/dL — ABNORMAL HIGH (ref 65–99)
Glucose-Capillary: 164 mg/dL — ABNORMAL HIGH (ref 65–99)
Glucose-Capillary: 152 mg/dL — ABNORMAL HIGH (ref 65–99)

## 2017-09-11 NOTE — BHH Group Notes (Signed)
Life SKills   Date:  09/11/2017  Time:  5:52 PM  Type of Therapy:  Nurse Education  /  Identifying Needs : The group focuses on teaching patients how to identify their needs and then hwo to develop the skills needed to get some of those needs met.  Participation Level:  Active  Participation Quality:  Appropriate  Affect:  Depressed  Cognitive:  Alert  Insight:  Appropriate  Engagement in Group:  Engaged  Modes of Intervention:  Education  Summary of Progress/Problems:  ,  Lynn 09/11/2017, 5:52 PM 

## 2017-09-11 NOTE — Plan of Care (Signed)
  Problem: Education: Goal: Emotional status will improve Outcome: Progressing   

## 2017-09-11 NOTE — Plan of Care (Signed)
D: Pt denies SI/HI/AVH. Pt is pleasant and cooperative. Pt visible in dayroom this evening A: Pt was offered support and encouragement. Pt was given scheduled medications. Pt was encourage to attend groups. Q 15 minute checks were done for safety.   R:Pt attends groups and interacts well with peers and staff. Pt is taking medication. Pt has no complaints.Pt receptive to treatment and safety maintained on unit.   Problem: Education: Goal: Emotional status will improve Outcome: Progressing   Problem: Education: Goal: Mental status will improve Outcome: Progressing   Problem: Activity: Goal: Sleeping patterns will improve Outcome: Progressing

## 2017-09-11 NOTE — Progress Notes (Signed)
[facility] MD Progress Note  09/11/2017 1:21 PM Cynthia [name]  MRN:  [id number] Subjective: Patient states "I feel okay".  Today minimizes depression and describes her mood as "better".  Denies medication side effects.  Denies suicidal ideations.States she had a good visit from sister/family yesterday evening and feels she has a good support system. Objective: I have reviewed chart notes and have met with patient . 27 year old female, lives with sister, presented to the emergency room on June 12 following an overdose ( Midol, possibly Metformin). Reports history of alcohol use disorder/drinks 3-4 times a week up to 5 drinks per sitting.  Today presents pleasant but somewhat superficial/appears  to minimize recent events.States " I am OK", and denies depression at present / does not endorse significant neurovegetative symptoms. Denies significant symptoms of alcohol withdrawal and does not appear to be in any acute distress or discomfort.  No distal tremors, no diaphoresis, no psychomotor agitation, no visual disturbances. Behavior on unit in good control.  Has been going to groups, visible on unit but limited interaction with peers Principal Problem: Depression, alcohol use disorder  Diagnosis:   Patient Active Problem List   Diagnosis Date Noted  . DM (diabetes mellitus), type 2 ([place]) [E11.9] 09/09/2017  . ETOH abuse [F10.10] 09/09/2017  . Lactic acidosis [E87.2] 09/09/2017  . Toxic encephalopathy [G92] 09/09/2017  . MDD (major depressive disorder), recurrent episode, severe ([place]) [F33.2] 09/09/2017  . MDD (major depressive disorder), recurrent severe, without psychosis ([place]) [F33.2]   . Overdose [T50.901A] 09/07/2017  . Altered mental status [R41.82]   . S/P total thyroidectomy [E89.0] 09/24/2015   Total Time spent with patient: 15 minutes  Past Psychiatric History:  Past Medical History:  Past Medical History:  Diagnosis Date  . Cancer ([place])    thyroid  . Depression   . GERD  (gastroesophageal reflux disease)   . Hypothyroidism     Past Surgical History:  Procedure Laterality Date  . PILONIDAL CYST DRAINAGE    . THYROIDECTOMY  09/24/2015  . THYROIDECTOMY N/A 09/24/2015   Procedure: TOTAL THYROIDECTOMY;  Surgeon: Ralene Ok, MD;  Location: [facility];  Service: General;  Laterality: N/A;   Family History: History reviewed. No pertinent family history. Family Psychiatric  History:  Social History:  Social History   Substance and Sexual Activity  Alcohol Use Yes  . Alcohol/week: 2.4 oz  . Types: 4 Cans of beer per week     Social History   Substance and Sexual Activity  Drug Use Yes  . Types: Marijuana    Social History   Socioeconomic History  . Marital status: Single    Spouse name: Not on file  . Number of children: Not on file  . Years of education: Not on file  . Highest education level: Not on file  Occupational History  . Not on file  Social Needs  . Financial resource strain: Not on file  . Food insecurity:    Worry: Not on file    Inability: Not on file  . Transportation needs:    Medical: Not on file    Non-medical: Not on file  Tobacco Use  . Smoking status: Never Smoker  . Smokeless tobacco: Never Used  Substance and Sexual Activity  . Alcohol use: Yes    Alcohol/week: 2.4 oz    Types: 4 Cans of beer per week  . Drug use: Yes    Types: Marijuana  . Sexual activity: Yes    Birth control/protection: None  Lifestyle  .  Physical activity:    Days per week: Not on file    Minutes per session: Not on file  . Stress: Not on file  Relationships  . Social connections:    Talks on phone: Not on file    Gets together: Not on file    Attends religious service: Not on file    Active member of club or organization: Not on file    Attends meetings of clubs or organizations: Not on file    Relationship status: Not on file  Other Topics Concern  . Not on file  Social History Narrative  . Not on file   Additional Social  History:    Pain Medications: denies Prescriptions: metformin and levothyroxine reported to RN Over the Counter: denies History of alcohol / drug use?: Yes Longest period of sobriety (when/how long): none Negative Consequences of Use: Personal relationships Withdrawal Symptoms: (denies) Name of Substance 1: alcohol 1 - Age of First Use: unknown 1 - Amount (size/oz): 3-4 beers 1 - Frequency: per day 1 - Duration: for a while 1 - Last Use / Amount: day ago Name of Substance 2: THC 2 - Age of First Use: [age] yoa 2 - Amount (size/oz): a bowl 2 - Frequency: socially 2 - Duration: every now and then 2 - Last Use / Amount: 07/2017  Sleep: Reports she slept well  Appetite:  Good  Current Medications: Current Facility-Administered Medications  Medication Dose Route Frequency Provider Last Rate Last Dose  . acetaminophen (TYLENOL) tablet 650 mg  650 mg Oral Q6H PRN Laverle Hobby, PA-C      . alum & mag hydroxide-simeth (MAALOX/MYLANTA) 200-200-20 MG/5ML suspension 30 mL  30 mL Oral Q4H PRN Laverle Hobby, PA-C      . hydrOXYzine (ATARAX/VISTARIL) tablet 25 mg  25 mg Oral Q6H PRN Patriciaann Clan E, PA-C      . insulin aspart (novoLOG) injection 0-20 Units  0-20 Units Subcutaneous TID WC Patriciaann Clan E, PA-C   4 Units at 09/11/17 1211  . levothyroxine (SYNTHROID, LEVOTHROID) tablet 200 mcg  200 mcg Oral QAC breakfast Laverle Hobby, PA-C   200 mcg at 09/11/17 3818  . loperamide (IMODIUM) capsule 2-4 mg  2-4 mg Oral PRN Laverle Hobby, PA-C      . LORazepam (ATIVAN) tablet 1 mg  1 mg Oral Q6H PRN Patriciaann Clan E, PA-C      . magnesium hydroxide (MILK OF MAGNESIA) suspension 30 mL  30 mL Oral Daily PRN Laverle Hobby, PA-C      . metFORMIN (GLUCOPHAGE-XR) 24 hr tablet 500 mg  500 mg Oral Q breakfast Laverle Hobby, PA-C   500 mg at 09/11/17 2993  . multivitamin with minerals tablet 1 tablet  1 tablet Oral Daily Laverle Hobby, PA-C   1 tablet at 09/11/17 0932  . ondansetron  (ZOFRAN-ODT) disintegrating tablet 4 mg  4 mg Oral Q6H PRN Laverle Hobby, PA-C      . sertraline (ZOLOFT) tablet 50 mg  50 mg Oral Daily Parlee Amescua, Myer Peer, MD   50 mg at 09/11/17 0927  . thiamine (B-1) injection 100 mg  100 mg Intramuscular Once Patriciaann Clan E, PA-C      . thiamine (VITAMIN B-1) tablet 100 mg  100 mg Oral Daily Patriciaann Clan E, PA-C   100 mg at 09/10/17 1258  . traZODone (DESYREL) tablet 50 mg  50 mg Oral QHS PRN Shakia Sebastiano, Myer Peer, MD  Lab Results:  Results for orders placed or performed during the hospital encounter of 09/09/17 (from the past 48 hour(s))  Glucose, capillary     Status: Abnormal   Collection Time: 09/09/17  9:09 PM  Result Value Ref Range   Glucose-Capillary 224 (H) 65 - 99 mg/dL  Glucose, capillary     Status: Abnormal   Collection Time: 09/10/17  6:24 AM  Result Value Ref Range   Glucose-Capillary 193 (H) 65 - 99 mg/dL  Glucose, capillary     Status: Abnormal   Collection Time: 09/10/17  5:30 PM  Result Value Ref Range   Glucose-Capillary 171 (H) 65 - 99 mg/dL  Glucose, capillary     Status: Abnormal   Collection Time: 09/10/17  8:55 PM  Result Value Ref Range   Glucose-Capillary 170 (H) 65 - 99 mg/dL   Comment 1 Notify RN   Glucose, capillary     Status: Abnormal   Collection Time: 09/11/17  6:15 AM  Result Value Ref Range   Glucose-Capillary 164 (H) 65 - 99 mg/dL   Comment 1 Notify RN    Comment 2 Document in Chart   Glucose, capillary     Status: Abnormal   Collection Time: 09/11/17 11:43 AM  Result Value Ref Range   Glucose-Capillary 151 (H) 65 - 99 mg/dL    Blood Alcohol level:  Lab Results  Component Value Date   ETH 121 (H) 31/49/7026    Metabolic Disorder Labs: Lab Results  Component Value Date   HGBA1C 7.6 (H) 09/07/2017   MPG 171.42 09/07/2017   No results found for: PROLACTIN No results found for: CHOL, TRIG, HDL, CHOLHDL, VLDL, LDLCALC  Physical Findings: AIMS: Facial and Oral Movements Muscles of  Facial Expression: None, normal Lips and Perioral Area: None, normal Jaw: None, normal Tongue: None, normal,Extremity Movements Upper (arms, wrists, hands, fingers): None, normal Lower (legs, knees, ankles, toes): None, normal, Trunk Movements Neck, shoulders, hips: None, normal, Overall Severity Severity of abnormal movements (highest score from questions above): None, normal Incapacitation due to abnormal movements: None, normal Patient's awareness of abnormal movements (rate only patient's report): No Awareness, Dental Status Current problems with teeth and/or dentures?: No Does patient usually wear dentures?: No  CIWA:  CIWA-Ar Total: 2 COWS:     Musculoskeletal: Strength & Muscle Tone: within normal limits-no current significant tremors, no diaphoresis, no restlessness Gait & Station: normal Patient leans: N/A  Psychiatric Specialty Exam: Physical Exam  ROS reports mild headache, no chest pain, no shortness of breath, no vomiting  Blood pressure (!) 136/53, pulse (!) 108, temperature 98.7 F (37.1 C), resp. rate 18, height '5\' 4"'$  (1.626 m), weight 78 kg (172 lb), SpO2 100 %.Body mass index is 29.52 kg/m.  General Appearance: Well-groomed  Eye Contact:  Fair  Speech:  Normal Rate  Volume:  Decreased  Mood:  Reports her mood is improved and toda denies/minimizes depression   Affect:  Vaguely restricted, but does smile at times briefly during session  Thought Process:  Linear and Descriptions of Associations: Intact  Orientation:  Other:  Fully alert and attentive  Thought Content:  No hallucinations, no delusions are expressed  Suicidal Thoughts:  No today denies any suicidal ideations, does not endorse any self-injurious ideations, contracts for safety on unit  Homicidal Thoughts:  No  Memory:  Recent and remote grossly intact  Judgement:  Fair  Insight:  Fair  Psychomotor Activity:  No psychomotor agitation or restlessness, no tremors, no diaphoresis  Concentration:  Concentration: Good and Attention Span: Good  Recall:  Good  Fund of Knowledge:  Good  Language:  Good  Akathisia:  Negative  Handed:  Right  AIMS (if indicated):     Assets:  Communication Skills Desire for Improvement Resilience  ADL's:  Intact  Cognition:  WNL  Sleep:      Assessment -27 year old female, lives with sister, presented on June 12 following overdose.  Reports history of alcohol use disorder, was drinking several times a week up to 5 drinks per sitting, admission blood alcohol level was 121. At this time minimizes/denies symptoms of alcohol withdrawal and does not appear to be in any acute distress or discomfort.  Does endorse mild headache and is slightly tachycardic.  No tremors or diaphoresis are noted, appears calm. Currently denies depression or neurovegetative symptoms. Currently on Zoloft which she is tolerating well thus far.  Treatment Plan Summary: Daily contact with patient to assess and evaluate symptoms and progress in treatment, Medication management, Plan Inpatient treatment and Medications as below Encourage group and milieu participation to work on coping skills and symptom reduction Encourage efforts to work on sobriety and relapse prevention Continue Zoloft at 50 mg daily for depression and anxiety Continue Ativan PRN's for alcohol withdrawal if needed as per CIWA protocol ContinueTrazodone 50 mg nightly PRN for insomnia as needed Treatment team working on disposition plans Jenne Campus, MD 09/11/2017, 1:21 PM

## 2017-09-11 NOTE — Progress Notes (Signed)
D Pt is seen OOB UAL on the [facility] hall today...she tolerates this well. She endorses a flat, depressed afect. She is plite and minimizes ehr needs when she interacts with this Probation officer.     A SHe completed her daily assessemnt and on this she wrote she deneid SI today and she rated her dperession, hopelessness and axneity " 4/2/5", respectively.     R Safety is in place and DC pending tomorrow.

## 2017-09-12 DIAGNOSIS — F332 Major depressive disorder, recurrent severe without psychotic features: Principal | ICD-10-CM

## 2017-09-12 LAB — GLUCOSE, CAPILLARY
Glucose-Capillary: 180 mg/dL — ABNORMAL HIGH (ref 65–99)
Glucose-Capillary: 177 mg/dL — ABNORMAL HIGH (ref 65–99)

## 2017-09-12 MED ORDER — TRAZODONE HCL 50 MG PO TABS: 50.0000 mg | ORAL_TABLET | Freq: Every evening | ORAL | 0 refills | Status: DC | PRN

## 2017-09-12 MED ORDER — METFORMIN HCL ER 500 MG PO TB24: 500.0000 mg | ORAL_TABLET | Freq: Every day | ORAL | 0 refills | Status: DC

## 2017-09-12 MED ORDER — LEVOTHYROXINE SODIUM 200 MCG PO TABS: 200.0000 ug | ORAL_TABLET | Freq: Every day | ORAL | 0 refills | Status: DC

## 2017-09-12 MED ORDER — HYDROXYZINE HCL 25 MG PO TABS: ORAL_TABLET | ORAL | 0 refills | Status: DC

## 2017-09-12 MED ORDER — SERTRALINE HCL 50 MG PO TABS: 50.0000 mg | ORAL_TABLET | Freq: Every day | ORAL | 0 refills | Status: DC

## 2017-09-12 NOTE — Progress Notes (Signed)
Adult Psychoeducational Group Note  Date:  09/12/2017 Time:  10:44 AM  Group Topic/Focus:  Wellness Toolbox:   The focus of this group is to discuss various aspects of wellness, balancing those aspects and exploring ways to increase the ability to experience wellness.  Patients will create a wellness toolbox for use upon discharge.  Participation Level:  Active  Participation Quality:  Appropriate  Affect:  Appropriate  Cognitive:  Appropriate  Insight: Appropriate  Engagement in Group:  Engaged  Modes of Intervention:  Discussion  Additional Comments:  Pt talked about what wellness means when it comes to her mental health.  Pt talked about some coping skills and she discussed the tools she uses when she feels low or down.  Cynthia [name] 09/12/2017, 10:44 AM

## 2017-09-12 NOTE — [facility] Suicide Risk Assessment (Signed)
[facility] INPATIENT:  Family/Significant Other Suicide Prevention Education  Suicide Prevention Education:  Education Completed; Imelda Pillow, sister, [phone], has been identified by the patient as the family member/significant other with whom the patient will be residing, and identified as the person(s) who will aid the patient in the event of a mental health crisis (suicidal ideations/suicide attempt).  With written consent from the patient, the family member/significant other has been provided the following suicide prevention education, prior to the and/or following the discharge of the patient.  The suicide prevention education provided includes the following:  Suicide risk factors  Suicide prevention and interventions  National Suicide Hotline telephone number  [facility] assessment telephone number  [facility] Emergency Assistance [place] and/or Residential Mobile Crisis Unit telephone number  Request made of family/significant other to:  Remove weapons (e.g., guns, rifles, knives), all items previously/currently identified as safety concern.  Ivy's husband has 2 handguns but they are locked.  Remove drugs/medications (over-the-counter, prescriptions, illicit drugs), all items previously/currently identified as a safety concern.  The family member/significant other verbalizes understanding of the suicide prevention education information provided.  The family member/significant other agrees to remove the items of safety concern listed above.  Karlene Einstein works for the guild raising money for [employer] and will work with pt to get set up for services.  Joanne Chars, LCSW 09/12/2017, 9:31 AM

## 2017-09-12 NOTE — Progress Notes (Signed)
Pt discharged home with the brother. Pt was ambulatory, stable and appreciative at that time. All papers and prescriptions were given and valuables returned. Verbal understanding expressed. Denies SI/HI and A/VH. Pt given opportunity to express concerns and ask questions.

## 2017-09-12 NOTE — Progress Notes (Signed)
Recreation Therapy Notes  Date: 6.17.19 Time: 0930 Location: [address] Hall Dayroom  Group Topic: Stress Management  Goal Area(s) Addresses:  Patient will verbalize importance of using healthy stress management.  Patient will identify positive emotions associated with healthy stress management.   Intervention: Stress Management  Activity :  Choice Meditation.  LRT played a meditation on choice.  Patients were to follow along as meditation was played to engage in activity.  Education:  Stress Management, Discharge Planning.   Education Outcome: Acknowledges edcuation/In group clarification offered/Needs additional education  Clinical Observations/Feedback: Pt did not attend group.    Victorino Sparrow, LRT/CTRS         Victorino Sparrow A 09/12/2017 11:52 AM

## 2017-09-12 NOTE — Tx Team (Signed)
Interdisciplinary Treatment and Diagnostic Plan Update  09/12/2017 Time of Session: 10:50am [name] MRN: [id number]  Principal Diagnosis: <principal problem not specified>  Secondary Diagnoses: Active Problems:   MDD (major depressive disorder), recurrent episode, severe ([facility])   Current Medications:  Current Facility-Administered Medications  Medication Dose Route Frequency Provider Last Rate Last Dose  . acetaminophen (TYLENOL) tablet 650 mg  650 mg Oral Q6H PRN Laverle Hobby, PA-C      . alum & mag hydroxide-simeth (MAALOX/MYLANTA) 200-200-20 MG/5ML suspension 30 mL  30 mL Oral Q4H PRN Laverle Hobby, PA-C      . hydrOXYzine (ATARAX/VISTARIL) tablet 25 mg  25 mg Oral Q6H PRN Patriciaann Clan E, PA-C      . insulin aspart (novoLOG) injection 0-20 Units  0-20 Units Subcutaneous TID WC Patriciaann Clan E, PA-C   4 Units at 09/12/17 [phone]  . levothyroxine (SYNTHROID, LEVOTHROID) tablet 200 mcg  200 mcg Oral QAC breakfast Laverle Hobby, PA-C   200 mcg at 09/12/17 4967  . loperamide (IMODIUM) capsule 2-4 mg  2-4 mg Oral PRN Laverle Hobby, PA-C      . LORazepam (ATIVAN) tablet 1 mg  1 mg Oral Q6H PRN Patriciaann Clan E, PA-C      . magnesium hydroxide (MILK OF MAGNESIA) suspension 30 mL  30 mL Oral Daily PRN Laverle Hobby, PA-C      . metFORMIN (GLUCOPHAGE-XR) 24 hr tablet 500 mg  500 mg Oral Q breakfast Patriciaann Clan E, PA-C   500 mg at 09/12/17 0818  . multivitamin with minerals tablet 1 tablet  1 tablet Oral Daily Laverle Hobby, PA-C   1 tablet at 09/12/17 0818  . ondansetron (ZOFRAN-ODT) disintegrating tablet 4 mg  4 mg Oral Q6H PRN Laverle Hobby, PA-C      . sertraline (ZOLOFT) tablet 50 mg  50 mg Oral Daily Cobos, Myer Peer, MD   50 mg at 09/12/17 0818  . thiamine (B-1) injection 100 mg  100 mg Intramuscular Once Patriciaann Clan E, PA-C      . thiamine (VITAMIN B-1) tablet 100 mg  100 mg Oral Daily Patriciaann Clan E, PA-C   100 mg at 09/12/17 0818  . traZODone  (DESYREL) tablet 50 mg  50 mg Oral QHS PRN Cobos, Myer Peer, MD       PTA Medications: Medications Prior to Admission  Medication Sig Dispense Refill Last Dose  . levothyroxine (SYNTHROID, LEVOTHROID) 200 MCG tablet Take 200 mcg by mouth daily before breakfast.   unknown  . metFORMIN (GLUCOPHAGE-XR) 500 MG 24 hr tablet Take 500 mg by mouth daily with breakfast.   Past Week at Unknown time    Patient Stressors: Marital or family conflict Substance abuse  Patient Strengths: Ability for insight Average or above average intelligence Financial means General fund of knowledge  Treatment Modalities: Medication Management, Group therapy, Case management,  1 to 1 session with clinician, Psychoeducation, Recreational therapy.   Physician Treatment Plan for Primary Diagnosis: <principal problem not specified> Long Term Goal(s): Improvement in symptoms so as ready for discharge Improvement in symptoms so as ready for discharge   Short Term Goals: Ability to identify changes in lifestyle to reduce recurrence of condition will improve Ability to maintain clinical measurements within normal limits will improve Ability to identify triggers associated with substance abuse/mental health issues will improve  Medication Management: Evaluate patient's response, side effects, and tolerance of medication regimen.  Therapeutic Interventions: 1 to 1 sessions, Unit Group sessions  and Medication administration.  Evaluation of Outcomes: Not Met  Physician Treatment Plan for Secondary Diagnosis: Active Problems:   MDD (major depressive disorder), recurrent episode, severe ([name])  Long Term Goal(s): Improvement in symptoms so as ready for discharge Improvement in symptoms so as ready for discharge   Short Term Goals: Ability to identify changes in lifestyle to reduce recurrence of condition will improve Ability to maintain clinical measurements within normal limits will improve Ability to identify  triggers associated with substance abuse/mental health issues will improve     Medication Management: Evaluate patient's response, side effects, and tolerance of medication regimen.  Therapeutic Interventions: 1 to 1 sessions, Unit Group sessions and Medication administration.  Evaluation of Outcomes: Not Met   RN Treatment Plan for Primary Diagnosis: <principal problem not specified> Long Term Goal(s): Knowledge of disease and therapeutic regimen to maintain health will improve  Short Term Goals: Ability to participate in decision making will improve, Ability to verbalize feelings will improve, Ability to disclose and discuss suicidal ideas, Ability to identify and develop effective coping behaviors will improve and Compliance with prescribed medications will improve  Medication Management: RN will administer medications as ordered by provider, will assess and evaluate patient's response and provide education to patient for prescribed medication. RN will report any adverse and/or side effects to prescribing provider.  Therapeutic Interventions: 1 on 1 counseling sessions, Psychoeducation, Medication administration, Evaluate responses to treatment, Monitor vital signs and CBGs as ordered, Perform/monitor CIWA, COWS, AIMS and Fall Risk screenings as ordered, Perform wound care treatments as ordered.  Evaluation of Outcomes: Not Met   LCSW Treatment Plan for Primary Diagnosis: <principal problem not specified> Long Term Goal(s): Safe transition to appropriate next level of care at discharge, Engage patient in therapeutic group addressing interpersonal concerns.  Short Term Goals: Engage patient in aftercare planning with referrals and resources, Increase social support, Increase ability to appropriately verbalize feelings, Increase emotional regulation, Facilitate acceptance of mental health diagnosis and concerns, Facilitate patient progression through stages of change regarding substance use  diagnoses and concerns, Identify triggers associated with mental health/substance abuse issues and Increase skills for wellness and recovery  Therapeutic Interventions: Assess for all discharge needs, 1 to 1 time with Social worker, Explore available resources and support systems, Assess for adequacy in community support network, Educate family and significant other(s) on suicide prevention, Complete Psychosocial Assessment, Interpersonal group therapy.  Evaluation of Outcomes: Not Met   Progress in Treatment: Attending groups: Yes. Participating in groups: Yes. Taking medication as prescribed: Yes. Toleration medication: Yes. Family/Significant other contact made: No, will contact:  the patient's sister Patient understands diagnosis: Yes. Discussing patient identified problems/goals with staff: Yes. Medical problems stabilized or resolved: Yes. Denies suicidal/homicidal ideation: Yes. Issues/concerns per patient self-inventory: No. Other:  New problem(s) identified: None   New Short Term/Long Term Goal(s):Detox, medication stabilization, elimination of SI thoughts, development of comprehensive mental wellness plan.   Patient Goals:  "to seek better outpatient treatment, like therapy"  Discharge Plan or Barriers: CSW will continue to assess for an appropriate discharge plan.  Reason for Continuation of Hospitalization: Anxiety Depression Medication stabilization Suicidal ideation  Estimated Length of Stay: 3-5 days   Attendees: Patient: Cynthia [name] 09/12/2017 8:39 AM  Physician: Dr. Neita Garnet, MD 09/12/2017 8:39 AM  Nursing: Benjamine Mola, RN 09/12/2017 8:39 AM  RN Care Manager: Rhunette Croft 09/12/2017 8:39 AM  Social Worker: Radonna Ricker, [place] 09/12/2017 8:39 AM  Recreational Therapist: Rhunette Croft 09/12/2017 8:39 AM  Other: Rhunette Croft 09/12/2017 8:39 AM  Other:  X 09/12/2017 8:39 AM  Other:X 09/12/2017 8:39 AM    Scribe for Treatment Team: Marylee Floras, [place] 09/12/2017 8:39 AM

## 2017-09-12 NOTE — Discharge Summary (Addendum)
Physician Discharge Summary Note  Patient:  Cynthia [name] is an 27 y.o., female  MRN:  [id number]  DOB:  [birth year]  Patient phone:  [phone] (home)   Patient address:   [place] [address],   Total Time spent with patient: Greater than 30 minutes  Date of Admission:  09/09/2017  Date of Discharge: 09-12-17  Reason for Admission: Medication overdose.  Principal Problem: MDD (major depressive disorder), recurrent episode, severe [facility])  Discharge Diagnoses: Patient Active Problem List   Diagnosis Date Noted  . MDD (major depressive disorder), recurrent episode, severe ([place]) [F33.2] 09/09/2017    Priority: High  . DM (diabetes mellitus), type 2 ([place]) [E11.9] 09/09/2017  . ETOH abuse [F10.10] 09/09/2017  . Lactic acidosis [E87.2] 09/09/2017  . Toxic encephalopathy [G92] 09/09/2017  . MDD (major depressive disorder), recurrent severe, without psychosis ([place]) [F33.2]   . Overdose [T50.901A] 09/07/2017  . Altered mental status [R41.82]   . S/P total thyroidectomy [E89.0] 09/24/2015   Past Psychiatric History: MDD  Past Medical History:  Past Medical History:  Diagnosis Date  . Cancer ([place])    thyroid  . Depression   . GERD (gastroesophageal reflux disease)   . Hypothyroidism     Past Surgical History:  Procedure Laterality Date  . PILONIDAL CYST DRAINAGE    . THYROIDECTOMY  09/24/2015  . THYROIDECTOMY N/A 09/24/2015   Procedure: TOTAL THYROIDECTOMY;  Surgeon: Ralene Ok, MD;  Location: [facility];  Service: General;  Laterality: N/A;   Family History: History reviewed. No pertinent family history.  Family Psychiatric  History: See H&P.  Social History:  Social History   Substance and Sexual Activity  Alcohol Use Yes  . Alcohol/week: 2.4 oz  . Types: 4 Cans of beer per week     Social History   Substance and Sexual Activity  Drug Use Yes  . Types: Marijuana    Social History   Socioeconomic History  . Marital status:  Single    Spouse name: Not on file  . Number of children: Not on file  . Years of education: Not on file  . Highest education level: Not on file  Occupational History  . Not on file  Social Needs  . Financial resource strain: Not on file  . Food insecurity:    Worry: Not on file    Inability: Not on file  . Transportation needs:    Medical: Not on file    Non-medical: Not on file  Tobacco Use  . Smoking status: Never Smoker  . Smokeless tobacco: Never Used  Substance and Sexual Activity  . Alcohol use: Yes    Alcohol/week: 2.4 oz    Types: 4 Cans of beer per week  . Drug use: Yes    Types: Marijuana  . Sexual activity: Yes    Birth control/protection: None  Lifestyle  . Physical activity:    Days per week: Not on file    Minutes per session: Not on file  . Stress: Not on file  Relationships  . Social connections:    Talks on phone: Not on file    Gets together: Not on file    Attends religious service: Not on file    Active member of club or organization: Not on file    Attends meetings of clubs or organizations: Not on file    Relationship status: Not on file  Other Topics Concern  . Not on file  Social History Narrative  . Not on  file   Hospital Course: (Per Md's admission assessment): 27 year old female, presented to ED via EMS on 6/12  following medication overdose. States her recollection of overdose is limited, fragmented, states it was impulsive and unplanned. She is unsure how many tablets and of what medications she took, but states it was likely Midol, possibly Metformin. She required initial admission to medical inpatient unit for stabilization. She states she was intoxicated with alcohol at the time, and states " honestly I don't think this would have happened if I was not drinking ". Admission BAL was 121, admission UDS negative. States she has history of depression and recently had been feeling more anxious, depressed , but states she did not have any  suicidal plans or intentions  prior to event.  Cynthia [name] was admitted to the [facility] adult unit with complaints of worsening symptoms of depression & crisis management due to impulsive drug overdose. She was in need of mood stabilization treatments. During the course of her treatment, she was medicated & discharged on, Sertraline 50 mg for depression, Hydroxyzine 25 mg prn for anxiety & Trazodone 50 mg insomnia. She was enrolled & participated in the group counseling sessions being offered & held on this unit. She was counseled & learned coping skills that should help her cope better & maintain mood stability after discharge. She was resumed on all her pertinent home medications for the other previously existing medical issues presented. She tolerated her treatment regimen without any adverse effects reported.   While her treatment was on going, Cynthia [name]'s improvement was monitored by observation & her daily reports of symptom reduction noted.  Her emotional & mental status were monitored by the daily self-inventory reports completed by her & the clinical staff. She was evaluated daily by the treatment team for mood stability & the need for continued recovery after discharge. She was offered further treatment options upon discharge & will follow up with the outpatient psychiatric services as listed below.     Upon discharge, Cynthia [name] was both mentally & medically stable. She is currently denying suicidal, homicidal ideation, auditory, visual/tactile hallucinations, delusional thoughts & or paranoia. She left [facility] with all personal belongings in no apparent distress. Transportation per brother.  Physical Findings: AIMS: Facial and Oral Movements Muscles of Facial Expression: None, normal Lips and Perioral Area: None, normal Jaw: None, normal Tongue: None, normal,Extremity Movements Upper (arms, wrists, hands, fingers): None, normal Lower (legs, knees, ankles, toes): None, normal, Trunk Movements Neck, shoulders,  hips: None, normal, Overall Severity Severity of abnormal movements (highest score from questions above): None, normal Incapacitation due to abnormal movements: None, normal Patient's awareness of abnormal movements (rate only patient's report): No Awareness, Dental Status Current problems with teeth and/or dentures?: No Does patient usually wear dentures?: No  CIWA:  CIWA-Ar Total: 1 COWS:     Musculoskeletal: Strength & Muscle Tone: within normal limits Gait & Station: normal Patient leans: N/A  Psychiatric Specialty Exam: Physical Exam  Constitutional: She appears well-developed.  HENT:  Head: Normocephalic.  Eyes: Pupils are equal, round, and reactive to light.  Neck: Normal range of motion.  Cardiovascular: Normal rate.  Respiratory: Effort normal.  GI: Soft.  Genitourinary:  Genitourinary Comments: Deferred  Musculoskeletal: Normal range of motion.  Neurological: She is alert.  Skin: Skin is warm.    Review of Systems  Constitutional: Negative.   HENT: Negative.   Eyes: Negative.   Respiratory: Negative.   Cardiovascular: Negative.   Gastrointestinal: Negative.   Genitourinary: Negative.  Musculoskeletal: Negative.   Skin: Negative.   Neurological: Negative.   Endo/Heme/Allergies: Negative.   Psychiatric/Behavioral: Positive for depression (Stable) and substance abuse (Hx. alcoholism, chronic). Negative for hallucinations, memory loss and suicidal ideas. The patient has insomnia (Stabilized with medication prior to discharge). The patient is not nervous/anxious.     Blood pressure 130/87, pulse 68, temperature 98.1 F (36.7 C), temperature source Oral, resp. rate 16, height 5\' 4"  (1.626 m), weight 78 kg (172 lb), SpO2 100 %.Body mass index is 29.52 kg/m.  See Md's SRA   Have you used any form of tobacco in the last 30 days? (Cigarettes, Smokeless Tobacco, Cigars, and/or Pipes): No  Has this patient used any form of tobacco in the last 30 days? (Cigarettes,  Smokeless Tobacco, Cigars, and/or Pipes): N/A  Blood Alcohol level:  Lab Results  Component Value Date   ETH 121 (H) 14/43/1540   Metabolic Disorder Labs:  Lab Results  Component Value Date   HGBA1C 7.6 (H) 09/07/2017   MPG 171.42 09/07/2017   No results found for: PROLACTIN No results found for: CHOL, TRIG, HDL, CHOLHDL, VLDL, LDLCALC  See Psychiatric Specialty Exam and Suicide Risk Assessment completed by Attending Physician prior to discharge.  Discharge destination:  Home  Is patient on multiple antipsychotic therapies at discharge:  No   Has Patient had three or more failed trials of antipsychotic monotherapy by history:  No  Recommended Plan for Multiple Antipsychotic Therapies: NA  Allergies as of 09/12/2017   No Known Allergies     Medication List    TAKE these medications     Indication  hydrOXYzine 25 MG tablet Commonly known as:  ATARAX/VISTARIL Take 1 tablet (25 mg) by mouth four times daily as needed: For anxiety  Indication:  Feeling Anxious   levothyroxine 200 MCG tablet Commonly known as:  SYNTHROID, LEVOTHROID Take 1 tablet (200 mcg total) by mouth daily before breakfast. For thyroid hormone replacement What changed:  additional instructions  Indication:  Underactive Thyroid   metFORMIN 500 MG 24 hr tablet Commonly known as:  GLUCOPHAGE-XR Take 1 tablet (500 mg total) by mouth daily with breakfast. For diabetes management Start taking on:  09/13/2017 What changed:  additional instructions  Indication:  Type 2 Diabetes   sertraline 50 MG tablet Commonly known as:  ZOLOFT Take 1 tablet (50 mg total) by mouth daily. For depression Start taking on:  09/13/2017  Indication:  Major Depressive Disorder   traZODone 50 MG tablet Commonly known as:  DESYREL Take 1 tablet (50 mg total) by mouth at bedtime as needed for sleep.  Indication:  [place] in 3 day(s).    Specialty:  Professional Counselor Why:  Please attend a walk in appt Monday-Friday between 8:30am-12noon or 1:00pm-2:30pm.  Please request therapy and medication management services. Contact information: [facility] [place] [address] [phone]          Follow-up recommendations: Activity:  As tolerated Diet: As recommended by your primary care doctor. Keep all scheduled follow-up appointments as recommended.    Comments: Patient is instructed prior to discharge to: Take all medications as prescribed by his/her mental healthcare provider. Report any adverse effects and or reactions from the medicines to his/her outpatient provider promptly. Patient has been instructed & cautioned: To not engage in alcohol and or illegal drug use while on prescription medicines. In the event  of worsening symptoms, patient is instructed to call the crisis hotline, 911 and or go to the nearest ED for appropriate evaluation and treatment of symptoms. To follow-up with his/her primary care provider for your other medical issues, concerns and or health care needs.   Signed: Lindell Spar, NP, PMHNP, FNP-BC 09/12/2017, 1:27 PM    Patient seen, Suicide Assessment Completed.  Disposition Plan Reviewed

## 2017-09-12 NOTE — BHH Suicide Risk Assessment (Addendum)
[facility] Discharge Suicide Risk Assessment   Principal Problem: overdose, consider alcohol use disorder  Discharge Diagnoses:  Patient Active Problem List   Diagnosis Date Noted  . DM (diabetes mellitus), type 2 ([place]) [E11.9] 09/09/2017  . ETOH abuse [F10.10] 09/09/2017  . Lactic acidosis [E87.2] 09/09/2017  . Toxic encephalopathy [G92] 09/09/2017  . MDD (major depressive disorder), recurrent episode, severe ([place]) [F33.2] 09/09/2017  . MDD (major depressive disorder), recurrent severe, without psychosis ([place]) [F33.2]   . Overdose [T50.901A] 09/07/2017  . Altered mental status [R41.82]   . S/P total thyroidectomy [E89.0] 09/24/2015    Total Time spent with patient: 30 minutes  Musculoskeletal: Strength & Muscle Tone: within normal limits-no diaphoresis, no tremors, no psychomotor restlessness, vitals are stable Gait & Station: normal Patient leans: N/A  Psychiatric Specialty Exam: ROS no headache, no visual disturbances, no chest pain, no shortness of breath, no vomiting  Blood pressure 130/87, pulse 68, temperature 98.1 F (36.7 C), temperature source Oral, resp. rate 16, height 5\' 4"  (1.626 m), weight 78 kg (172 lb), SpO2 100 %.Body mass index is 29.52 kg/m.  General Appearance: Improved grooming  Eye Contact::  improving   Speech:  Normal Rate409  Volume:  Normal  Mood:  Reports she is feeling better and denies feeling depressed at this time  Affect:  Appropriate, reactive  Thought Process:  Linear and Descriptions of Associations: Intact  Orientation:  Other:  Fully alert and attentive  Thought Content:  Denies hallucinations, no delusions are expressed, does not appear to be internally preoccupied  Suicidal Thoughts:  No-denies suicidal ideations, denies self-injurious ideations  Homicidal Thoughts:  No-denies any violent or homicidal ideations  Memory:  Recent and remote grossly intact  Judgement:  Other:  Fair-improving  Insight:  Fair  Psychomotor Activity:  Normal-no  current symptoms of alcohol withdrawal, presents calm without any psychomotor restlessness  Concentration:  Good  Recall:  Good  Fund of Knowledge:Good  Language: Good  Akathisia:  Negative  Handed:  Right  AIMS (if indicated):     Assets:  Communication Skills Desire for Improvement Resilience  Sleep:  Number of Hours: 6.75  Cognition: WNL  ADL's:  Intact   Mental Status Per Nursing Assessment::   On Admission:  Suicidal ideation indicated by patient, Intention to act on plan to harm others  Demographic Factors:  27 year old single female, no children, lives with a sister, unemployed at this time, in college but has taken this semester off  Loss Factors: Unemployment, academic/financial issues regarding college  Historical Factors: No prior psychiatric admissions, no history of suicide attempts, no history of psychosis endorses Alcohol use disorder, drinks 3-4 times per week,4 drinks per episode.  Answers 2 out of 4 CAGE questions affirmatively.  Risk Reduction Factors:   Sense of responsibility to family, Living with another person, especially a relative, Positive social support and Positive coping skills or problem solving skills  Continued Clinical Symptoms:  At this time patient is alert, attentive, calm, in no acute distress, minimizes depression at this time and reports her mood is normal, affect is appropriate and reactive, no thought disorder is noted, denies suicidal ideations, denies homicidal or violent ideations, no hallucinations, no delusions are expressed, she is future oriented, stating that she plans to return to college this fall. Currently she is not presenting with symptoms of alcohol withdrawal. We have reviewed alcohol use disorder and its likely negative impact on quality of life and mental health.  Patient has stated that her overdose was impulsive  and occurred during intoxication.  Have encouraged patient to consider abstinence as integral part of treatment  goals.  We discussed medications such as Campral, currently not interested, denies cravings. She is tolerating medications well-is on Synthroid for history of thyroidectomy, and on Zoloft ( new trial) for depression/anxiety. Behavior on unit in good control, presents polite and calm on approach 6/17 EKG- NSR, QTc 451   Cognitive Features That Contribute To Risk:  No gross cognitive deficits noted upon discharge. Is alert , attentive, and oriented x 3   Suicide Risk:  Mild:  Suicidal ideation of limited frequency, intensity, duration, and specificity.  There are no identifiable plans, no associated intent, mild dysphoria and related symptoms, good self-control (both objective and subjective assessment), few other risk factors, and identifiable protective factors, including available and accessible social support.  Follow-up [place] in 3 day(s).   Specialty:  Professional Counselor Why:  Please attend a walk in appt Monday-Friday between 8:30am-12noon or 1:00pm-2:30pm.  Please request therapy and medication management services. Contact information: [facility] [place] [address] [phone]           Plan Of Care/Follow-up recommendations:  Activity:  As tolerated Diet:  Regular Tests:  NA Other:  See below  Patient is expressing desire to discharge and has submitted letter requesting discharge, there are no current grounds for involuntary commitment and she is leaving unit in good spirits . She plans to return home. Follow-up as above.  She also plans to follow-up with her primary care doctor regarding management of hypothyroidism/medical issues as needed.  Jenne Campus, MD 09/12/2017, 12:28 PM

## 2017-09-12 NOTE — BHH Group Notes (Signed)
[place] LCSW Group Therapy Note  Date/Time: 09/12/17, 1315  Type of Therapy and Topic:  Group Therapy:  Overcoming Obstacles  Participation Level:  moderate  Description of Group:    In this group patients will be encouraged to explore what they see as obstacles to their own wellness and recovery. They will be guided to discuss their thoughts, feelings, and behaviors related to these obstacles. The group will process together ways to cope with barriers, with attention given to specific choices patients can make. Each patient will be challenged to identify changes they are motivated to make in order to overcome their obstacles. This group will be process-oriented, with patients participating in exploration of their own experiences as well as giving and receiving support and challenge from other group members.  Therapeutic Goals: 1. Patient will identify personal and current obstacles as they relate to admission. 2. Patient will identify barriers that currently interfere with their wellness or overcoming obstacles.  3. Patient will identify feelings, thought process and behaviors related to these barriers. 4. Patient will identify two changes they are willing to make to overcome these obstacles:    Summary of Patient Progress: Pt shared that guilt and regret are current obstacles in her life.  Pt was mostly quiet during group discussions regarding positive ways to overcome or make progress in getting past obstacles but she did respond to CSW questions.       Therapeutic Modalities:   Cognitive Behavioral Therapy Solution Focused Therapy Motivational Interviewing Relapse Prevention Therapy  Lurline Idol, LCSW

## 2017-09-12 NOTE — Progress Notes (Signed)
  [facility] Adult Case Management Discharge Plan :  Will you be returning to the same living situation after discharge:  Yes,  with sister At discharge, do you have transportation home?: Yes,  brother Do you have the ability to pay for your medications: Yes,  BCBS  Release of information consent forms completed and in the chart;  Patient's signature needed at discharge.  Patient to Follow up at: Follow-up [place] in 3 day(s).   Specialty:  Professional Counselor Why:  Please attend a walk in appt Monday-Friday between 8:30am-12noon or 1:00pm-2:30pm.  Please request therapy and medication management services. Contact information: [facility] [place] [phone]           Next level of care provider has access to [place] and Suicide Prevention discussed: Yes,  with sister  Have you used any form of tobacco in the last 30 days? (Cigarettes, Smokeless Tobacco, Cigars, and/or Pipes): No  Has patient been referred to the Quitline?: N/A patient is not a smoker  Patient has been referred for addiction treatment: Pt. refused referral  Joanne Chars, [place] 09/12/2017, 9:37 AM

## 2018-04-25 IMAGING — CR DG CHEST 2V
2 series · 2 of 2 positions shown · non-contrast
Comparison: None.

CLINICAL DATA: Preop for thyroidectomy.

EXAM:
CHEST  2 VIEW

[w chest pa]
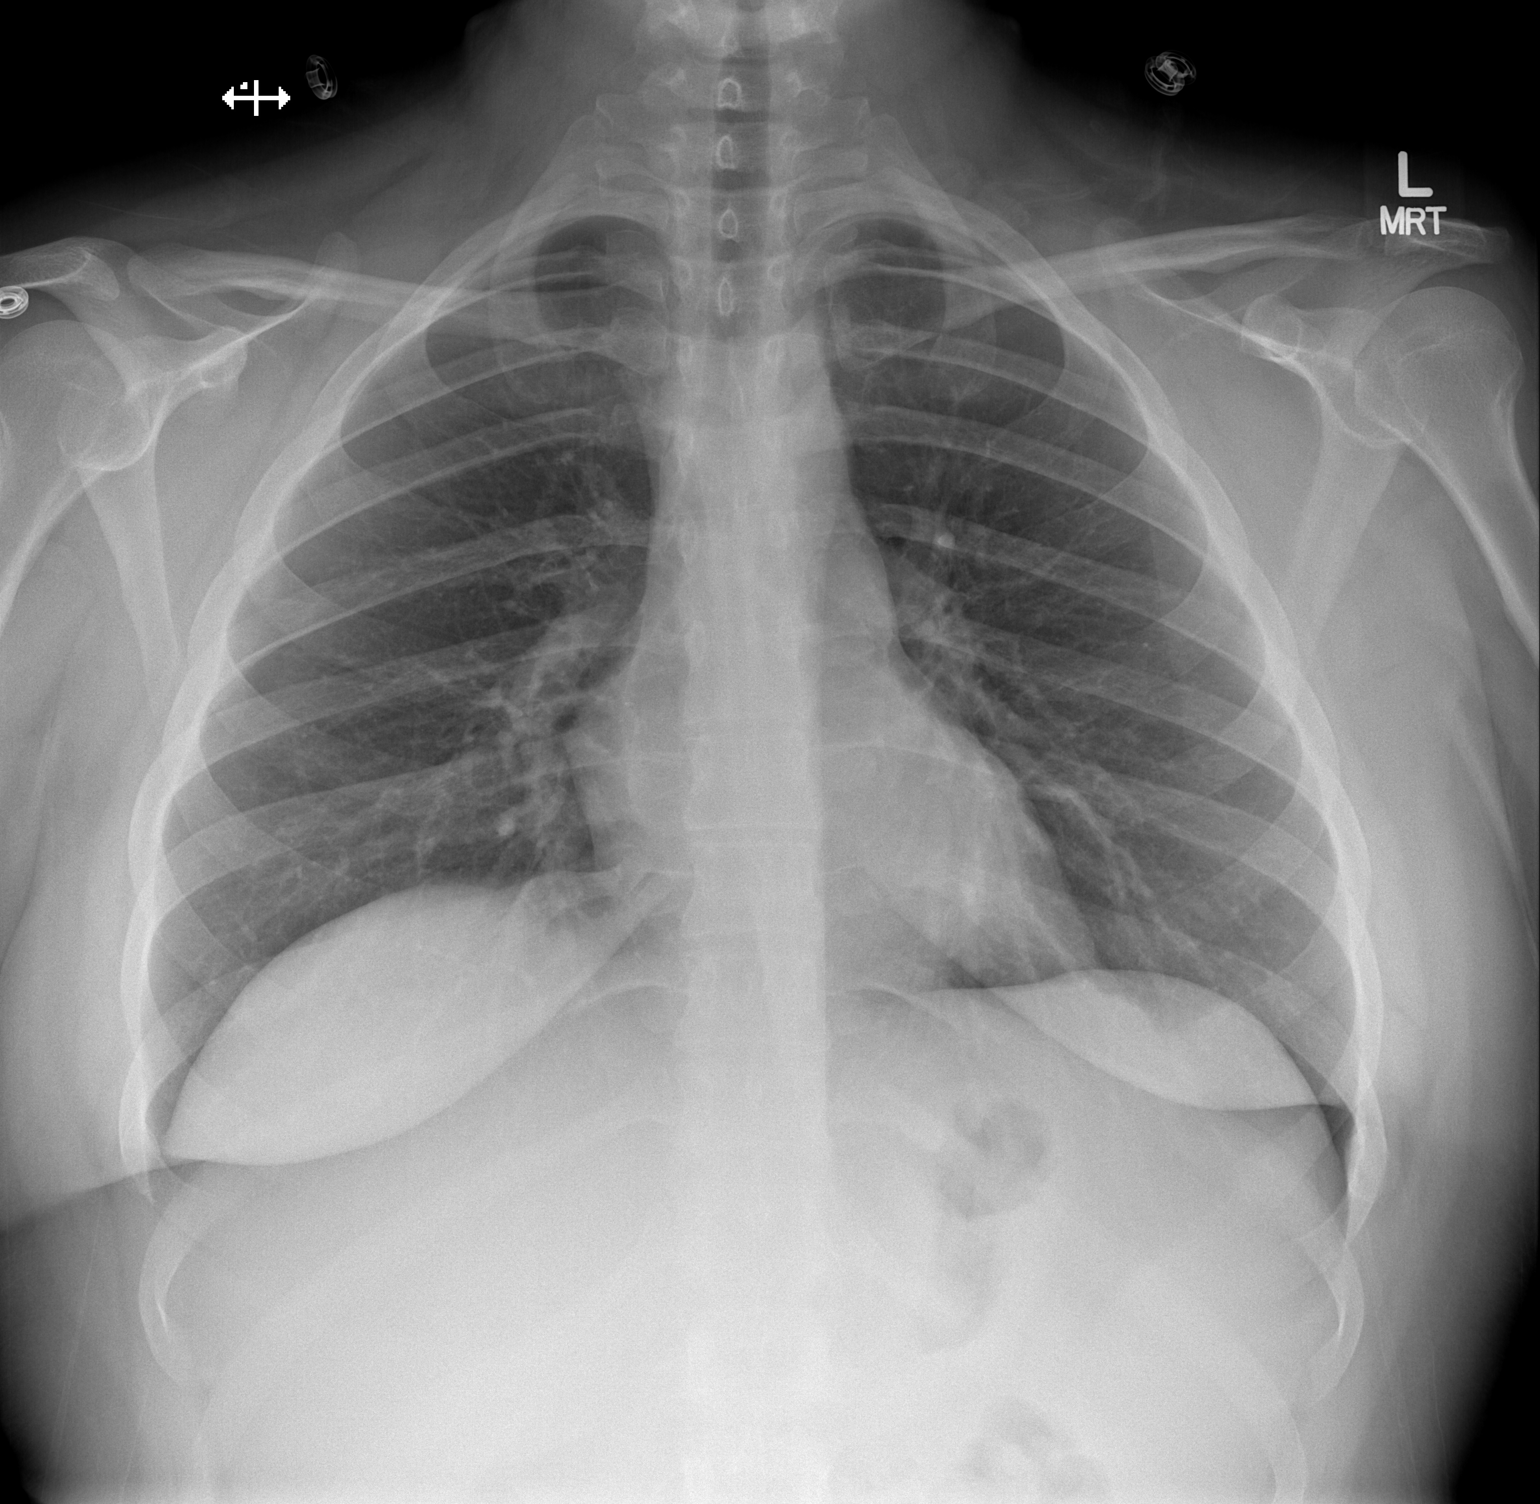

[w chest lat]
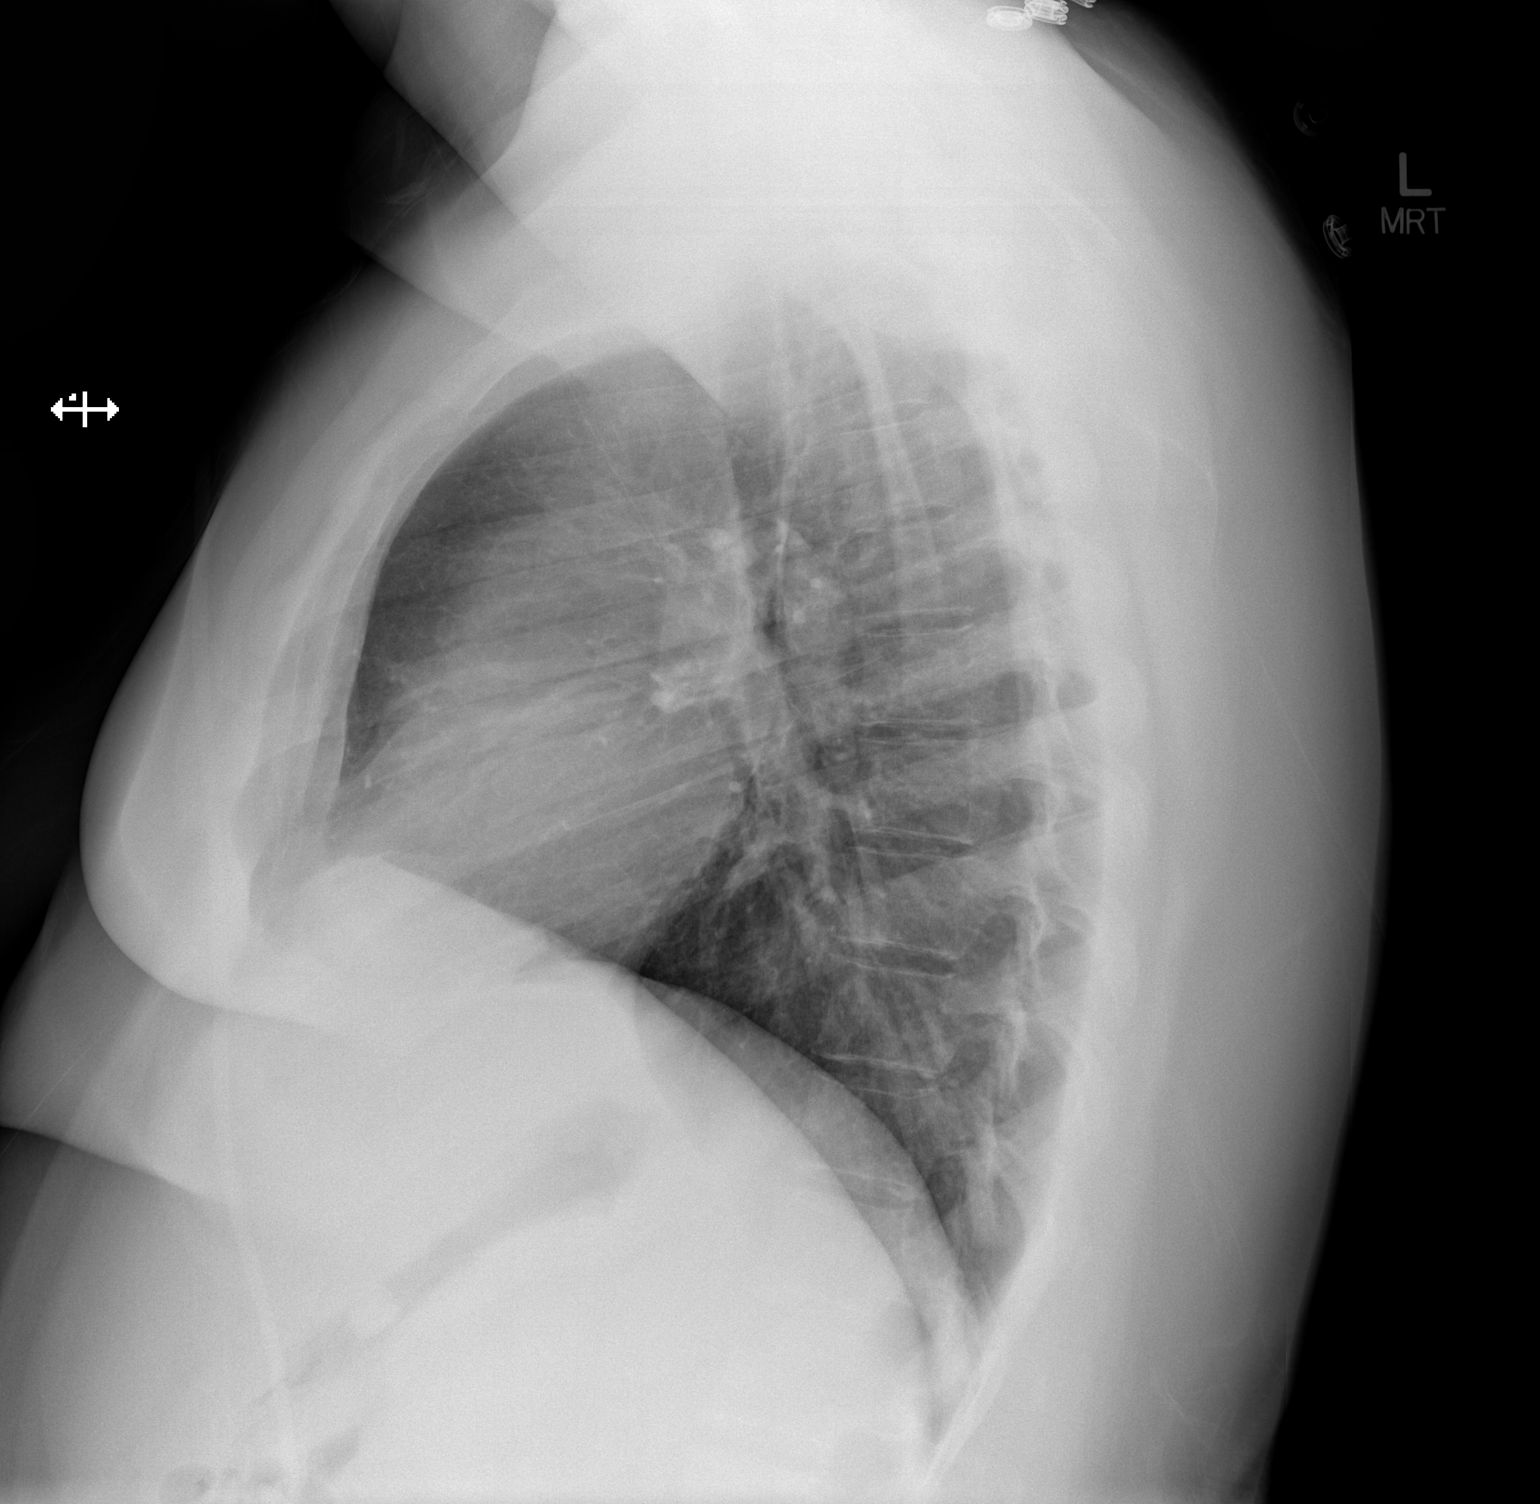

[2 of 2 positions shown; findings below may reference images not displayed]

FINDINGS: The heart size and mediastinal contours are within normal limits.
Both lungs are clear. The visualized skeletal structures are
unremarkable.
IMPRESSION: No active cardiopulmonary disease.

## 2018-05-19 DIAGNOSIS — H6692 Otitis media, unspecified, left ear: Secondary | ICD-10-CM | POA: Diagnosis not present

## 2018-05-19 DIAGNOSIS — H7292 Unspecified perforation of tympanic membrane, left ear: Secondary | ICD-10-CM | POA: Diagnosis not present

## 2018-05-30 IMAGING — US US ABDOMEN LIMITED
1 series · 14 of 25 positions shown · non-contrast
Comparison: None.

CLINICAL DATA: Elevated liver function tests.

EXAM:
US ABDOMEN LIMITED - RIGHT UPPER QUADRANT

[Series 1: us abdomen limited · 0.24mm/px · 14 of 47 slices shown]
[im 1/47]
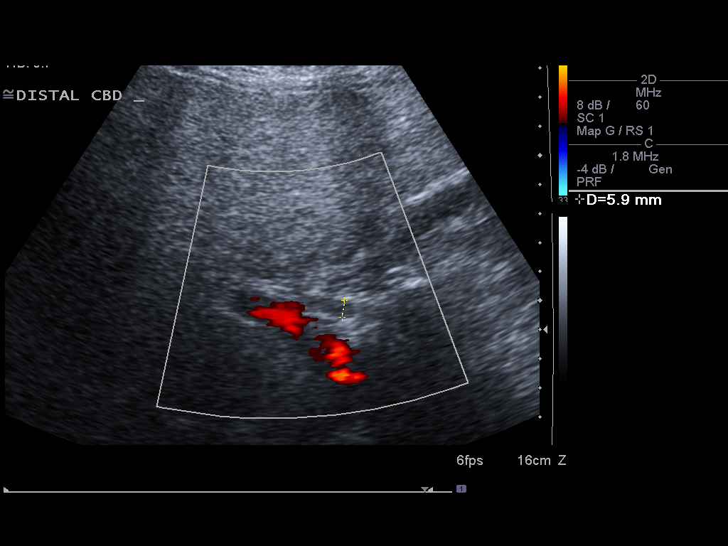
[im 4/47]
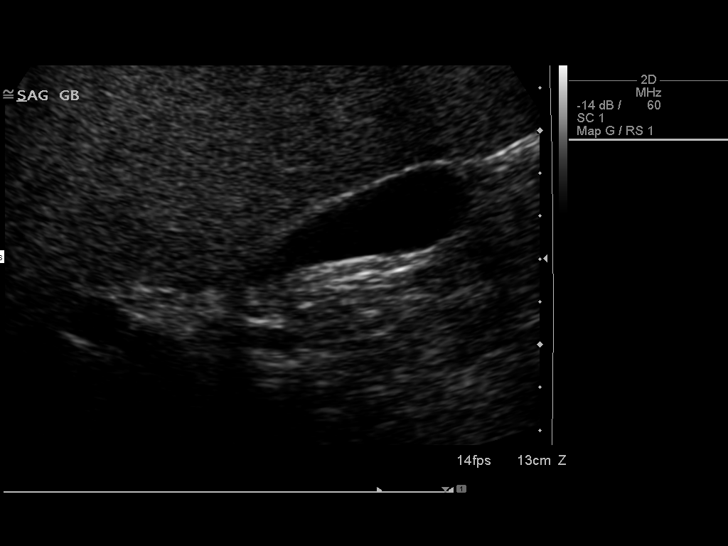
[im 8/47]
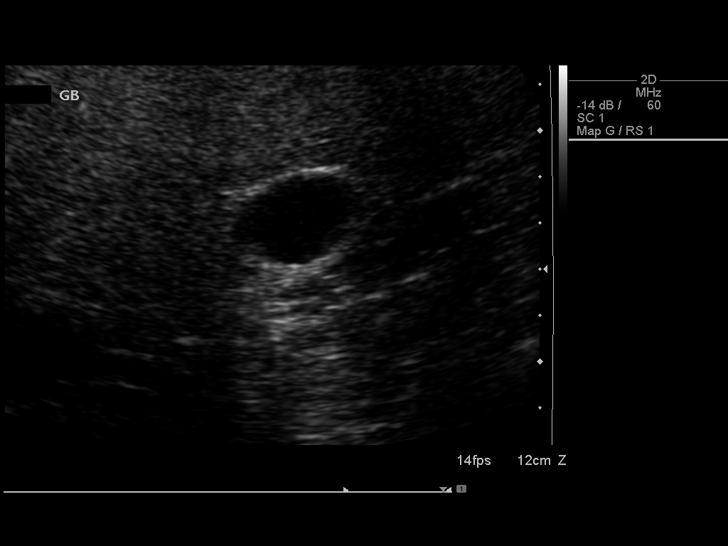
[im 12/47]
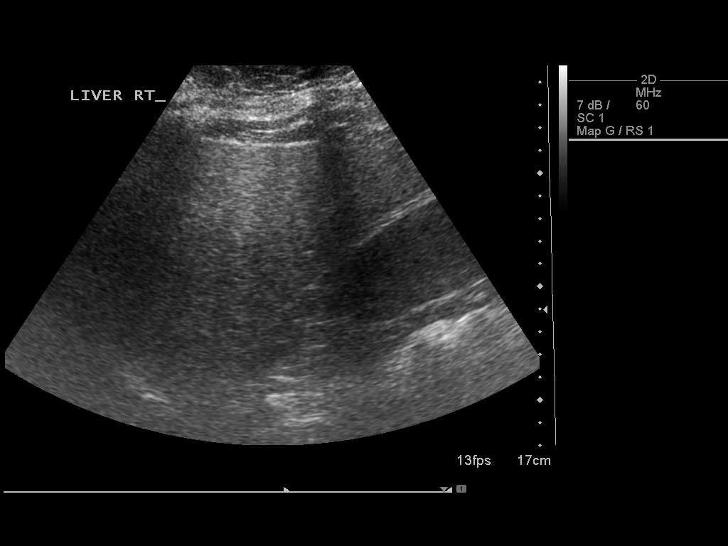
[im 16/47]
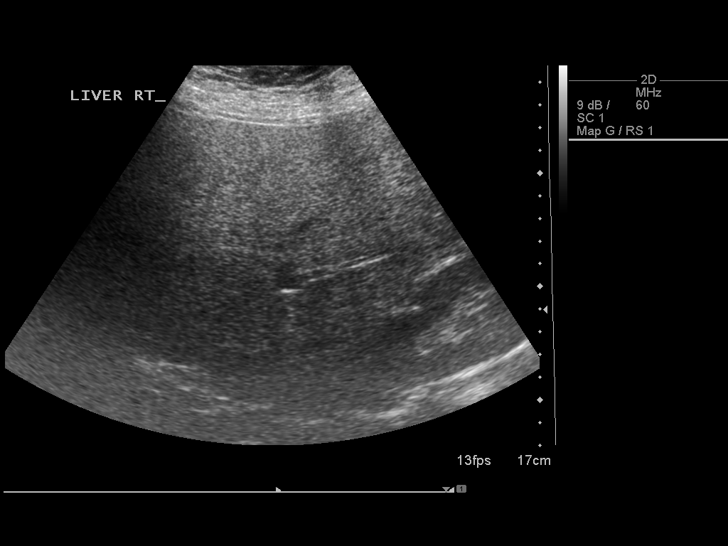
[im 18/47]
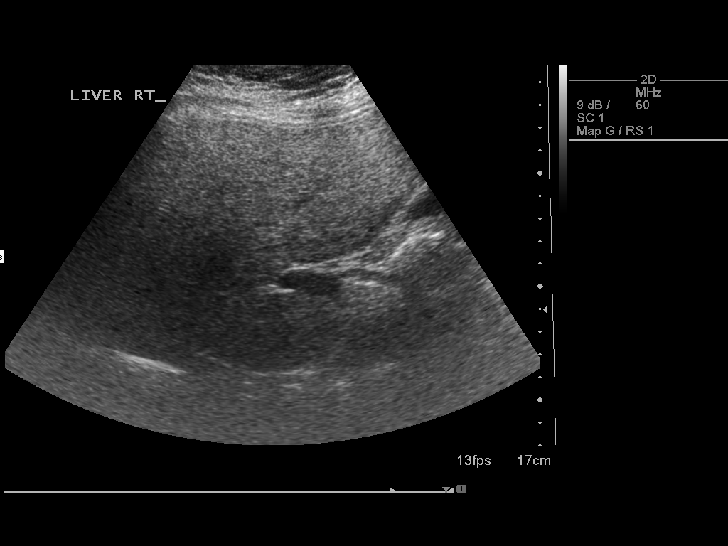
[im 22/47]
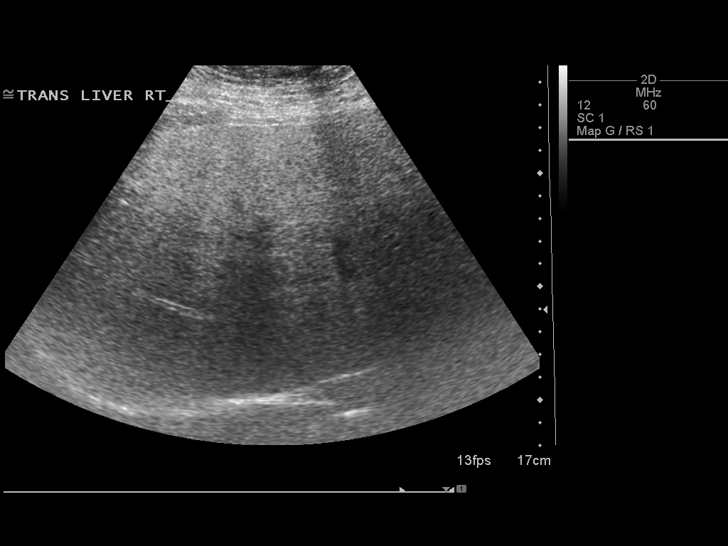
[im 25/47]
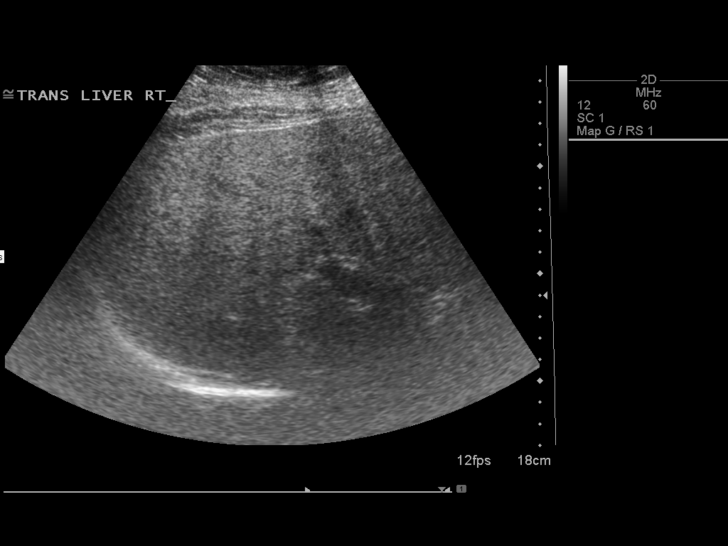
[im 29/47]
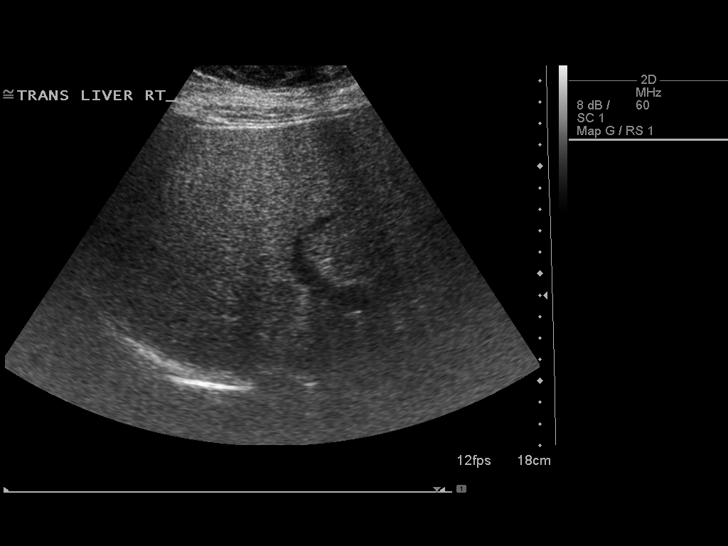
[im 31/47]
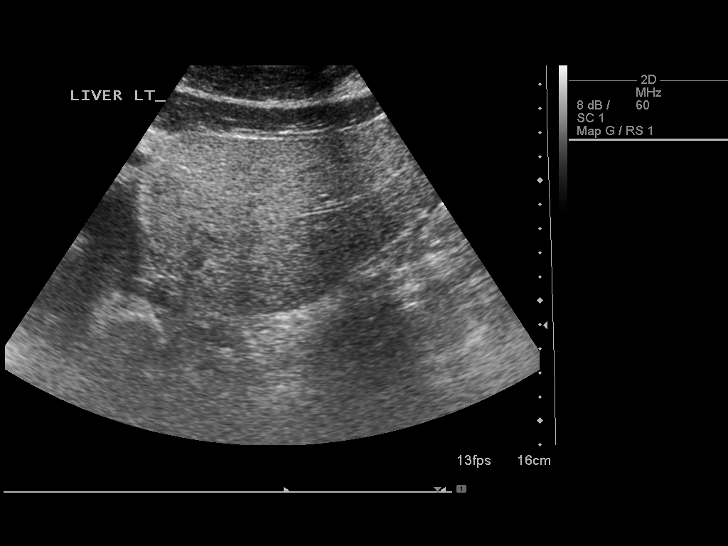
[im 35/47]
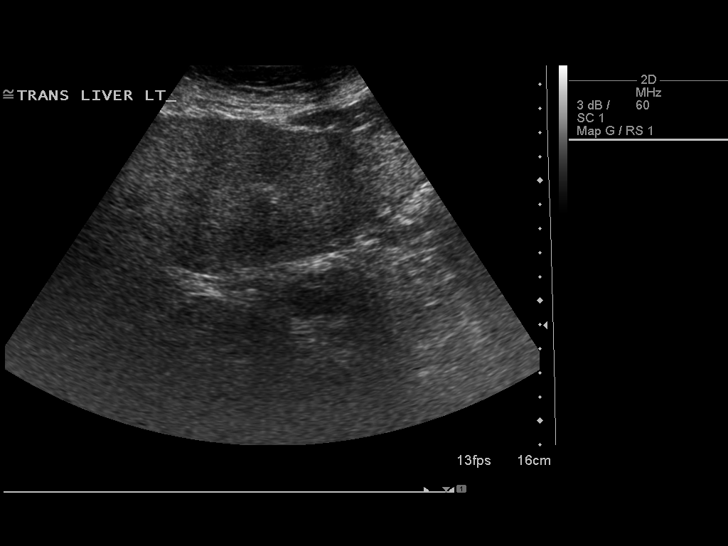
[im 39/47]
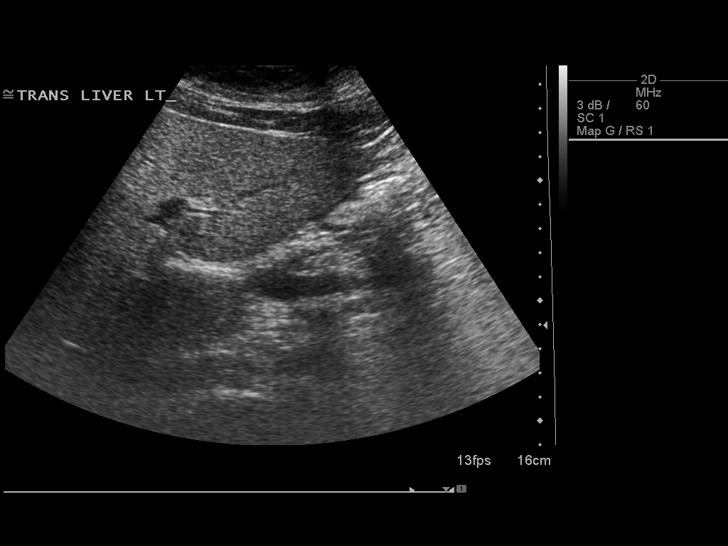
[im 43/47]
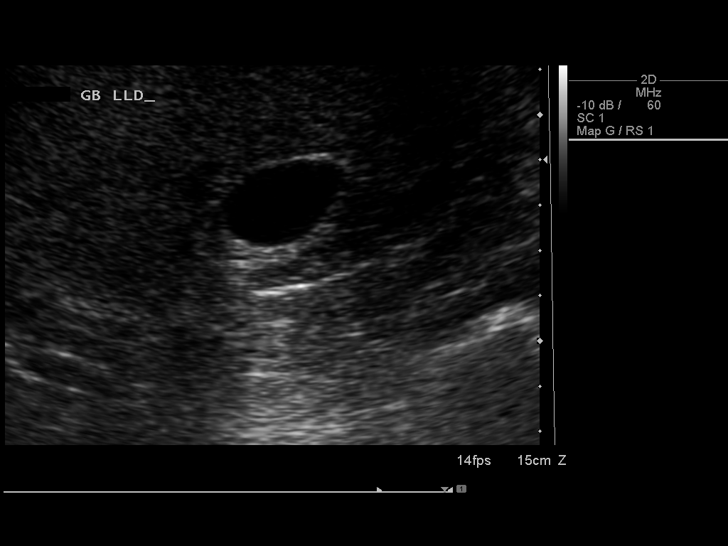
[im 47/47]
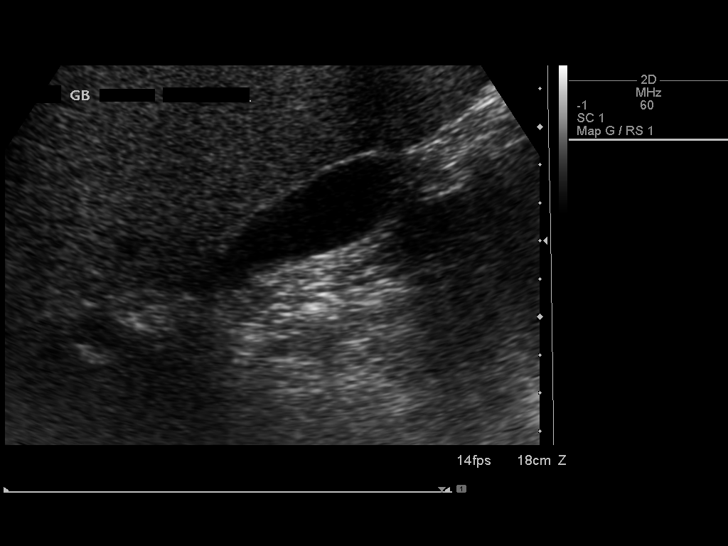

[14 of 25 positions shown; findings below may reference images not displayed]

FINDINGS: Gallbladder:

No gallstones or wall thickening visualized. No sonographic Murphy
sign noted by sonographer.

Common bile duct:

Diameter: Normal, 5.9 mm.

Liver:

No focal lesion identified. Slightly inhomogeneous. Increased
echogenicity.
IMPRESSION: Echogenic liver. No focal lesions. No gallstones or wall thickening.
No biliary ductal dilatation.

## 2018-06-14 IMAGING — NM NM RAI THERAPY CANCER THYROID
1 series · 1 of 1 positions shown · non-contrast
Comparison: none

CLINICAL DATA: 24-year-old female with papillary thyroid carcinoma.
1.6 cm lesion with positive margins. Negative lymph node. Low risk
disease.

[st static image · 1 of 1 slices shown]
[im 1/1]
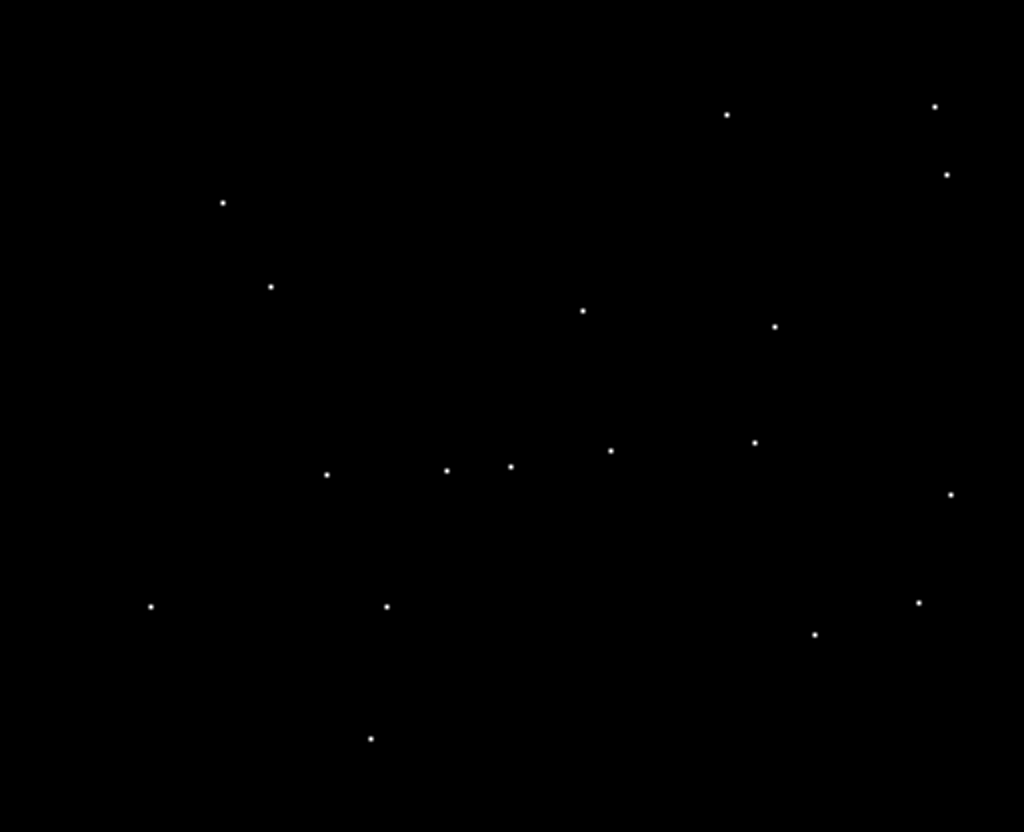

[1 of 1 positions shown; findings below may reference images not displayed]

EXAM:
RADIOACTIVE IODINE THERAPY FOR THYROID CANCER

PROCEDURE:
The risks and benefits of radioactive iodine therapy were discussed
with the patient in detail. Alternative therapies were also
mentioned. Radiation safety was discussed with the patient,
including how to protect the general public from exposure. There
were no barriers to communication. Written consent was obtained. The
patient then received a capsule containing the radiopharmaceutical.

The patient will follow-up with the referring physician.

RADIOPHARMACEUTICALS:  54.1 mCi P-C9C sodium iodide
FINDINGS: Patient presents for thyroid remnant ablation and adjuvant therapy
for papillary thyroid carcinoma low risk disease with positive
margin.
IMPRESSION: Per oral administration of P-C9C sodium iodide for thyroid remnant
ablation and adjuvant therapy.

## 2021-05-19 ENCOUNTER — Emergency Department (HOSPITAL_BASED_OUTPATIENT_CLINIC_OR_DEPARTMENT_OTHER): Payer: Self-pay

## 2021-05-19 ENCOUNTER — Emergency Department (HOSPITAL_BASED_OUTPATIENT_CLINIC_OR_DEPARTMENT_OTHER)
Admission: EM | Admit: 2021-05-19 | Discharge: 2021-05-20 | Disposition: A | Discharge status: Home / Self Care | Payer: Self-pay | Attending: Emergency Medicine | Admitting: Emergency Medicine

## 2021-05-19 ENCOUNTER — Encounter (HOSPITAL_BASED_OUTPATIENT_CLINIC_OR_DEPARTMENT_OTHER): Payer: Self-pay | Admitting: *Deleted

## 2021-05-19 ENCOUNTER — Other Ambulatory Visit: Payer: Self-pay

## 2021-05-19 DIAGNOSIS — S72102A Unspecified trochanteric fracture of left femur, initial encounter for closed fracture: Secondary | ICD-10-CM

## 2021-05-19 DIAGNOSIS — S72112A Displaced fracture of greater trochanter of left femur, initial encounter for closed fracture: Secondary | ICD-10-CM | POA: Insufficient documentation

## 2021-05-19 DIAGNOSIS — W01198A Fall on same level from slipping, tripping and stumbling with subsequent striking against other object, initial encounter: Secondary | ICD-10-CM | POA: Insufficient documentation

## 2021-05-19 LAB — URINALYSIS, ROUTINE W REFLEX MICROSCOPIC
Bilirubin Urine: NEGATIVE
Glucose, UA: >=500 mg/dL — AB
Hgb urine dipstick: NEGATIVE
Ketones, ur: 15 mg/dL — AB
Leukocytes,Ua: NEGATIVE
Nitrite: NEGATIVE
Protein, ur: NEGATIVE mg/dL
Specific Gravity, Urine: 1.015 (ref 1.005–1.030)
pH: 6 (ref 5.0–8.0)

## 2021-05-19 LAB — URINALYSIS, MICROSCOPIC (REFLEX)

## 2021-05-19 LAB — PREGNANCY, URINE: Preg Test, Ur: NEGATIVE

## 2021-05-19 NOTE — ED Notes (Signed)
Pt has not urinated. Unable to do xrays without pregnancy results.

## 2021-05-19 NOTE — ED Triage Notes (Signed)
She slipped and fell on a hardwood floor landing on her left hip. Pain radiates from her hip to her knee.

## 2021-05-20 ENCOUNTER — Emergency Department (HOSPITAL_BASED_OUTPATIENT_CLINIC_OR_DEPARTMENT_OTHER): Payer: Self-pay

## 2021-05-20 MED ORDER — IBUPROFEN 600 MG PO TABS: 600.0000 mg | ORAL_TABLET | Freq: Four times a day (QID) | ORAL | 0 refills | Status: DC | PRN

## 2021-05-20 MED ORDER — HYDROCODONE-ACETAMINOPHEN 5-325 MG PO TABS
1.0000 | ORAL_TABLET | Freq: Once | ORAL | Status: AC
  Administered 2021-05-20: 1 via ORAL
  Filled 2021-05-20: qty 1

## 2021-05-20 MED ORDER — HYDROCODONE-ACETAMINOPHEN 5-325 MG PO TABS: 1.0000 | ORAL_TABLET | Freq: Four times a day (QID) | ORAL | 0 refills | Status: DC | PRN

## 2021-05-20 NOTE — Discharge Instructions (Signed)
Please call and make appointment with orthopedic surgeron this week for nondisplaced communited fracture of femur trochanter  Rest leg, elevated, ice, motrin for mild pain, norco for severe pain

## 2021-05-20 NOTE — ED Provider Notes (Signed)
[facility] EMERGENCY DEPARTMENT Provider Note   CSN: [id number] Arrival date & time: 05/19/21  2001     History  Chief Complaint  Patient presents with   Cynthia [name] is a 31 y.o. female.  Pt is a 31 yo female presenting to ED for fall. Pt states she slipped on object on the floor and fell hitting her hip bone. States she stayed down for 30 minutes due to pain. Difficulty with ambulation due to pain. Able to bear weight. Denies sensation or motor deficits. Denies head trauma. No blood thinner use. No LoC.   The history is provided by the patient. No language interpreter was used.  Fall Pertinent negatives include no chest pain, no abdominal pain and no shortness of breath.      Home Medications Prior to Admission medications   Medication Sig Start Date End Date Taking? Authorizing Provider  FLUoxetine (PROZAC) 20 MG capsule TAKE 1 CAPSULE(20 MG) BY MOUTH DAILY 01/16/21  Yes [provider]  HYDROcodone-acetaminophen (NORCO) 5-325 MG tablet Take 1 tablet by mouth every 6 (six) hours as needed for severe pain. 1/51/76  Yes Campbell Stall P, DO  ibuprofen (ADVIL) 600 MG tablet Take 1 tablet (600 mg total) by mouth every 6 (six) hours as needed for mild pain. 1/60/73  Yes Campbell Stall P, DO  levothyroxine (SYNTHROID, LEVOTHROID) 200 MCG tablet Take 1 tablet (200 mcg total) by mouth daily before breakfast. For thyroid hormone replacement 09/12/17  Yes Lindell Spar I, NP  traZODone (DESYREL) 50 MG tablet Take 1 tablet (50 mg total) by mouth at bedtime as needed for sleep. 09/12/17  Yes Lindell Spar I, NP  hydrOXYzine (ATARAX/VISTARIL) 25 MG tablet Take 1 tablet (25 mg) by mouth four times daily as needed: For anxiety 09/12/17   Lindell Spar I, NP  metFORMIN (GLUCOPHAGE-XR) 500 MG 24 hr tablet Take 1 tablet (500 mg total) by mouth daily with breakfast. For diabetes management 09/13/17   Lindell Spar I, NP  sertraline (ZOLOFT) 50 MG tablet Take 1 tablet (50 mg  total) by mouth daily. For depression 09/13/17   Encarnacion Slates, NP      Allergies    Patient has no known allergies.    Review of Systems   Review of Systems  Constitutional:  Negative for chills and fever.  HENT:  Negative for ear pain and sore throat.   Eyes:  Negative for pain and visual disturbance.  Respiratory:  Negative for cough and shortness of breath.   Cardiovascular:  Negative for chest pain and palpitations.  Gastrointestinal:  Negative for abdominal pain and vomiting.  Genitourinary:  Negative for dysuria and hematuria.  Musculoskeletal:  Positive for gait problem. Negative for arthralgias and back pain.  Skin:  Negative for color change and rash.  Neurological:  Negative for seizures and syncope.  All other systems reviewed and are negative.  Physical Exam Updated Vital Signs BP 119/77 (BP Location: Right Arm)    Pulse 79    Temp 99.1 F (37.3 C) (Oral)    Resp 19    Ht 5\' 4"  (1.626 m)    Wt 66.2 kg    LMP  (LMP Unknown)    SpO2 100%    BMI 25.06 kg/m  Physical Exam Vitals and nursing note reviewed.  Constitutional:      General: She is not in acute distress.    Appearance: She is well-developed.  HENT:     Head: Normocephalic and atraumatic.  Eyes:     Conjunctiva/sclera: Conjunctivae normal.  Cardiovascular:     Rate and Rhythm: Normal rate and regular rhythm.     Heart sounds: No murmur heard. Pulmonary:     Effort: Pulmonary effort is normal. No respiratory distress.     Breath sounds: Normal breath sounds.  Abdominal:     Palpations: Abdomen is soft.     Tenderness: There is no abdominal tenderness.  Musculoskeletal:        General: No swelling.     Cervical back: Neck supple.     Right hip: Normal.     Left hip: Normal.     Right upper leg: Normal.     Left upper leg: Bony tenderness present. No deformity.     Right knee: Normal.     Left knee: Normal.     Right lower leg: Normal.     Left lower leg: Normal.     Right ankle: Normal.     Left  ankle: Normal.     Right foot: Normal. Normal pulse.     Left foot: Normal. Normal pulse.  Skin:    General: Skin is warm and dry.     Capillary Refill: Capillary refill takes less than 2 seconds.  Neurological:     Mental Status: She is alert.  Psychiatric:        Mood and Affect: Mood normal.    ED Results / Procedures / Treatments   Labs (all labs ordered are listed, but only abnormal results are displayed) Labs Reviewed  URINALYSIS, ROUTINE W REFLEX MICROSCOPIC - Abnormal; Notable for the following components:      Result Value   Glucose, UA >=500 (*)    Ketones, ur 15 (*)    All other components within normal limits  URINALYSIS, MICROSCOPIC (REFLEX) - Abnormal; Notable for the following components:   Bacteria, UA RARE (*)    All other components within normal limits  PREGNANCY, URINE    EKG None  Radiology CT Hip Left Wo Contrast  Result Date: 05/20/2021 CLINICAL DATA:  Status post trauma. EXAM: CT OF THE LEFT HIP WITHOUT CONTRAST TECHNIQUE: Multidetector CT imaging of the left hip was performed according to the standard protocol. Multiplanar CT image reconstructions were also generated. RADIATION DOSE REDUCTION: This exam was performed according to the departmental dose-optimization program which includes automated exposure control, adjustment of the mA and/or kV according to patient size and/or use of iterative reconstruction technique. COMPARISON:  None. FINDINGS: Bones/Joint/Cartilage Acute, nondisplaced fracture deformity is seen extending through the greater trochanter of the proximal left femur. There is no evidence of dislocation. No associated lytic or blastic lesions are identified. Ligaments Suboptimally assessed by CT. Muscles and Tendons Unremarkable. Soft tissues Unremarkable. IMPRESSION: Acute, nondisplaced fracture of the greater trochanter of the proximal left femur. Electronically Signed   By: Virgina Norfolk M.D.   On: 05/20/2021 01:28   CT FEMUR LEFT WO  CONTRAST  Result Date: 05/20/2021 CLINICAL DATA:  Status post trauma. EXAM: CT OF THE LOWER LEFT EXTREMITY WITHOUT CONTRAST TECHNIQUE: Multidetector CT imaging of the lower left extremity was performed according to the standard protocol. RADIATION DOSE REDUCTION: This exam was performed according to the departmental dose-optimization program which includes automated exposure control, adjustment of the mA and/or kV according to patient size and/or use of iterative reconstruction technique. COMPARISON:  None. FINDINGS: Bones/Joint/Cartilage An acute, nondisplaced fracture deformity is seen extending through the greater trochanter of the proximal left femur. There is no evidence of dislocation.  No associated lytic or blastic lesions are identified. Ligaments Suboptimally assessed by CT. Muscles and Tendons Unremarkable. Soft tissues Unremarkable. IMPRESSION: Acute, nondisplaced fracture of the greater trochanter of the proximal left femur. Electronically Signed   By: Virgina Norfolk M.D.   On: 05/20/2021 01:30   DG Hip Unilat W or Wo Pelvis 2-3 Views Left  Result Date: 05/19/2021 CLINICAL DATA:  [place] on left hip EXAM: DG HIP (WITH OR WITHOUT PELVIS) 2-3V LEFT COMPARISON:  None. FINDINGS: Mildly displaced, comminuted fracture of the greater trochanter of the left femur, which does not appear to significantly involve the intertrochanteric portion of the femur. No other acute fracture. No significant degenerative changes in the acetabuli. Evaluation of the sacrum is limited by overlying bowel gas. IMPRESSION: Mildly displaced, comminuted fracture of the greater trochanter of the left femur. Electronically Signed   By: Merilyn Baba M.D.   On: 05/19/2021 22:47    Procedures Procedures    Medications Ordered in ED Medications  HYDROcodone-acetaminophen (NORCO/VICODIN) 5-325 MG per tablet 1 tablet (1 tablet Oral Given 05/20/21 9924)    ED Course/ Medical Decision Making/ A&P                            Medical Decision Making Amount and/or Complexity of Data Reviewed Labs: ordered. Radiology: ordered.  Risk Prescription drug management.   12:24 AM  31 yo female presenting to ED for fall. Pt is Aox3, no acute distress, stable vitals. Pain to left trochanter. Leg neurovascularly intact. Xray demonstrates: Mildly displaced, comminuted fracture of the greater trochanter of the left femur.  CT demonstrates the same. No hip fractures.   Patient recommended for leg immobilization. No leg immobilizers stocked at [facility]. Crutches given with recommendations for f/u tomorrow morning with orthopedics for proper immobilization. Ortho on call called and agreeable to see patient.   Patient in no distress and overall condition improved here in the ED. Detailed discussions were had with the patient regarding current findings, and need for close f/u with PCP or on call doctor. The patient has been instructed to return immediately if the symptoms worsen in any way for re-evaluation. Patient verbalized understanding and is in agreement with current care plan. All questions answered prior to discharge.         Final Clinical Impression(s) / ED Diagnoses Final diagnoses:  Closed fracture of trochanter of left femur, initial encounter [facility])    Rx / DC Orders ED Discharge Orders          Ordered    ibuprofen (ADVIL) 600 MG tablet  Every 6 hours PRN        05/20/21 0225    HYDROcodone-acetaminophen (NORCO) 5-325 MG tablet  Every 6 hours PRN        05/20/21 0225              Campbell Stall P, DO 26/83/41 1121

## 2023-10-28 NOTE — H&P (Signed)
 Gastroenterology Preprocedural History and Physical     Chief Complaint/Reason for Procedure: Cynthia [name] is a 33 y.o. female scheduled for a EGD, for the following indication Dysphagia, GERD, early satiety , abd pain using deep sedation with propofol  or general anesthesia as per anesthesia provider .  A History and Physical has been performed and patient medication allergies have been reviewed. The patient's tolerance of previous anesthesia has been reviewed. The risks and benefits of the procedure and the sedation options and risks were discussed with the patient. All questions were answered and informed consent obtained.  HPI  Medical History[1]  Surgical History[2]  Family History[3]  Social History   Socioeconomic History  . Marital status: Single    Spouse name: Not on file  . Number of children: Not on file  . Years of education: Not on file  . Highest education level: Not on file  Occupational History  . Not on file  Tobacco Use  . Smoking status: Never  . Smokeless tobacco: Never  Vaping Use  . Vaping status: Never Used  Substance and Sexual Activity  . Alcohol use: Yes  . Drug use: Never    Comment: Drug use: Drug Use: Not Currently; Previously has used marijuana, currently occasionally uses Delta-8  . Sexual activity: Yes    Birth control/protection: Condom  Other Topics Concern  . Not on file  Social History Narrative  . Not on file   Social Drivers of Health   Food Insecurity: Low Risk  (08/16/2023)   Food vital sign   . Within the past 12 months, you worried that your food would run out before you got money to buy more: Never true   . Within the past 12 months, the food you bought just didn't last and you didn't have money to get more: Never true  Transportation Needs: No Transportation Needs (08/16/2023)   Transportation   . In the past 12 months, has lack of reliable transportation kept you from medical appointments, meetings, work or from getting  things needed for daily living? : No  Safety: Low Risk  (08/16/2023)   Safety   . How often does anyone, including family and friends, physically hurt you?: Never   . How often does anyone, including family and friends, insult or talk down to you?: Never   . How often does anyone, including family and friends, threaten you with harm?: Never   . How often does anyone, including family and friends, scream or curse at you?: Never  Living Situation: Low Risk  (08/16/2023)   Living Situation   . What is your living situation today?: I have a steady place to live   . Think about the place you live. Do you have problems with any of the following? Choose all that apply:: None/None on this list    Current Rx ordered in [facility][4]  Allergies[5]    Physical Exam:  Vitals:   10/28/23 0827  BP: 108/80  Pulse: 98  Resp: 17  Temp: 97.6 F (36.4 C)  TempSrc: Skin  SpO2: 99%  Weight: 59 kg (130 lb)  Height: 1.626 m (5' 4)   Body mass index is 22.31 kg/m.  Airway:  MALLAMPATI TWO   Heart:  normal S1 and S2 Lungs:  clear Abdomen:  soft, nontender, normal bowel sounds Mental Status:  awake and alert; oriented to person, place, and time     ASA Grade Assessment: ASA 2 - Patient with mild systemic disease with no functional limitations  I have reviewed patient's health history and patient is cleared to proceed with the proposed procedure at this facility.   Shanna JINNY Denmark, MD       [1] Past Medical History: Diagnosis Date  . Anxiety   . Cancer    (CMD)    thyroid   . Depression   [2] Past Surgical History: Procedure Laterality Date  . PILONIDAL CYST DRAINAGE     Procedure: PILONIDAL CYST DRAINAGE  . THYROIDECTOMY  09/2015   Procedure: THYROIDECTOMY  [3] Family History Problem Relation Name Age of Onset  . Heart failure Mother    . Depression Mother    . Alcohol abuse Mother    . Hyperlipidemia Father    . Polycystic ovary disease Sister    . Depression Sister          and anxiety   . Breast cancer Maternal Aunt    . Cancer Maternal Aunt         breast cancer   . Breast cancer Maternal Grandmother    . Cancer Maternal Grandmother         breast cancer  . Glaucoma Neg Hx    . Macular degeneration Neg Hx    [4] Meds Ordered in [facility]  Medication Sig Dispense Refill  . FLUoxetine (PROzac) 20 mg capsule Take 1 capsule (20 mg total) by mouth daily. Take in addition to 40 mg for total daily dose of 60 mg. 90 capsule 3  . FLUoxetine (PROzac) 40 mg capsule Take 1 capsule (40 mg total) by mouth daily. Take in addition to 20 mg for total daily dose of 60 mg. 90 capsule 3  . glipiZIDE (GLUCOTROL XL) 10 mg 24 hr tablet TAKE 1 TABLET(10 MG) BY MOUTH DAILY 90 tablet 1  . glucose blood (Blood Glucose Test) test strip Use as instructed 100 strip 11  . glucose monitoring kit kit 1 each by miscellaneous route 2 (two) times a day. 1 each 0  . hydrOXYzine  (ATARAX ) 25 mg tablet Take 1 tablet (25 mg total) by mouth every 8 (eight) hours as needed for anxiety. 120 tablet 1  . Lancets misc 1 Device by miscellaneous route 2 (two) times a day. 100 each 11  . levothyroxine  (SYNTHROID ) 125 mcg tablet Take 2 tabs (250 mcg) PO Mon-Sat 180 tablet 1  . metFORMIN  (GLUCOPHAGE ) 500 mg tablet Take 2 tablets (1,000 mg total) by mouth in the morning and 2 tablets (1,000 mg total) in the evening. Take with meals. 360 tablet 0  . rosuvastatin (CRESTOR) 10 mg tablet TAKE 1 TABLET(10 MG) BY MOUTH DAILY 90 tablet 1  . traZODone  (DESYREL ) 50 mg tablet Take 1 tablet (50 mg total) by mouth at bedtime. 30 tablet 3   No current Epic-ordered facility-administered medications on file.  [5] Allergies Allergen Reactions  . Other Omega-3s Other (See Comments)  . Wellbutrin [Bupropion Hcl] Other (See Comments)    Lack of concentration/difficulty speaking

## 2023-10-28 NOTE — Anesthesia Preprocedure Evaluation (Signed)
 Patient: Cynthia [name]  Procedure Information     Date/Time: 10/28/23 0900   Scheduled providers: Shanna JINNY Denmark, MD; Ellaree Bard Sharps, CRNA; Camellia FORBES Bull, MD   Procedure: ESOPHAGOGASTRODUODENOSCOPY   Location: [facility] ENDOSCOPY       Relevant Problems  ENDOCRINE  (+) DM (diabetes mellitus), type 2 (HCC)  (+) Postsurgical hypothyroidism    BP 108/80   Pulse 98   Temp 97.6 F (36.4 C) (Skin)   Resp 17   Ht 1.626 m (5' 4)   Wt 59 kg (130 lb)   LMP 08/11/2023 (Exact Date)   SpO2 99%   BMI 22.31 kg/m    Clinical information reviewed:  Tobacco  Allergies  Meds  Med Hx  Surg Hx  OB Status  Fam Hx  Soc  Hx     Anesthesia Evaluation  PONV Predictive Score (Scale 0-5):  Apfel risk score: 0 Endocrine: Patient has type 2 diabetes and hypothyroidism.     Physical Exam  Airway  Mallampati: II TM Distance (FB): 3 Oral Aperture (FB): 3 Neck ROM: full ROM Cardiovascular - normal exam Dental - normal exam Pulmonary - normal exam Abdominal - normal exam HEENT - normal exam Neurological  Neurological: alert and alert and oriented to person, place, time, and situation Functional Status  Exercise Tolerance: >4 METS   Anesthesia Plan  Review Preop documentation reviewed: H&P reviewed, Surgeon's Note Reviewed, Blood Availability Reviewed, NPO Status Reviewed, Preop Vitals Reviewed, Previous Anesthesia Records Reviewed and Periop Tests and Results Reviewed  Plan ASA score: 3  Anesthesia type: MAC Induction: intravenous Post-op plan: PACU  Informed Consent Anesthetic plan and risks discussed with patient. Use of blood products discussed with patient whom consented to blood products. Plan discussed with CRNA.   Date of Last Liquid: 10/28/23 Time of last liquid: 0600 (sip of water) Date of Last Solid: 10/27/23 Time of last solid: 2100

## 2023-10-28 NOTE — Anesthesia Postprocedure Evaluation (Signed)
 Patient: TEMICA RIGHETTI  Procedure Summary     Date: 10/28/23 Room / Location: [facility] ENDOSCOPY   Anesthesia Start: [phone] Anesthesia Stop: [phone]   Procedure: ESOPHAGOGASTRODUODENOSCOPY Diagnosis:      Dysphagia, unspecified type     Gastroesophageal reflux disease, unspecified whether esophagitis present     Generalized abdominal pain     Early satiety     Nausea and vomiting, unspecified vomiting type   Scheduled Providers: Shanna JINNY Denmark, MD; Ellaree Bard Sharps, CRNA; Camellia FORBES Bull, MD Responsible Provider: Camellia FORBES Bull, MD   Anesthesia Type: MAC ASA Status: 3       Anesthesia Type: MAC  Vitals Value Taken Time  BP 108/71 10/28/23 10:06  Temp 97 F (36.1 C) 10/28/23 09:52  Pulse 74 10/28/23 10:06  Resp 13 10/28/23 10:06  SpO2 100 % 10/28/23 10:06  Vitals shown include unfiled device data.  No notable events documented.  Anesthesia Post Evaluation  Final anesthesia type: MAC Patient location during evaluation: PACU Patient participation: Patient participated Level of consciousness: awake and alert Pain score: pain well controlled (patient comfortable/resting) Pain management: adequately controlled during entire PACU stay Post-op nausea and vomiting?: none Post-op vital signs: post-procedure vital signs are stable Patient temperature: Normothermic Respiratory status: Stable, room air, spontaneous Hydration status: adequately hydrated Post-op disposition: Home Anesthesia post-op complications?:no complications

## 2023-11-28 ENCOUNTER — Other Ambulatory Visit (HOSPITAL_COMMUNITY)
Admission: EM | Admit: 2023-11-28 | Discharge: 2023-12-05 | Disposition: A | Discharge status: Home / Self Care | Payer: MEDICAID | Source: Intra-hospital | Attending: Psychiatry | Admitting: Psychiatry

## 2023-11-28 ENCOUNTER — Ambulatory Visit (HOSPITAL_COMMUNITY)
Admission: EM | Admit: 2023-11-28 | Discharge: 2023-11-28 | Disposition: A | Discharge status: Other Acute Inpatient Hospital | Payer: MEDICAID | Attending: Psychiatry | Admitting: Psychiatry

## 2023-11-28 DIAGNOSIS — F1010 Alcohol abuse, uncomplicated: Secondary | ICD-10-CM | POA: Diagnosis present

## 2023-11-28 DIAGNOSIS — F339 Major depressive disorder, recurrent, unspecified: Secondary | ICD-10-CM | POA: Insufficient documentation

## 2023-11-28 DIAGNOSIS — Z79899 Other long term (current) drug therapy: Secondary | ICD-10-CM | POA: Insufficient documentation

## 2023-11-28 DIAGNOSIS — Z79890 Hormone replacement therapy (postmenopausal): Secondary | ICD-10-CM | POA: Insufficient documentation

## 2023-11-28 DIAGNOSIS — Z7984 Long term (current) use of oral hypoglycemic drugs: Secondary | ICD-10-CM | POA: Insufficient documentation

## 2023-11-28 DIAGNOSIS — F329 Major depressive disorder, single episode, unspecified: Secondary | ICD-10-CM | POA: Insufficient documentation

## 2023-11-28 DIAGNOSIS — F332 Major depressive disorder, recurrent severe without psychotic features: Secondary | ICD-10-CM | POA: Diagnosis present

## 2023-11-28 LAB — COMPREHENSIVE METABOLIC PANEL WITH GFR
ALT: 93 U/L — ABNORMAL HIGH (ref 0–44)
AST: 163 U/L — ABNORMAL HIGH (ref 15–41)
Albumin: 3.8 g/dL (ref 3.5–5.0)
Alkaline Phosphatase: 88 U/L (ref 38–126)
Anion gap: 15 (ref 5–15)
BUN: <5 mg/dL — ABNORMAL LOW (ref 6–20)
CO2: 24 mmol/L (ref 22–32)
Calcium: 8.9 mg/dL (ref 8.9–10.3)
Chloride: 100 mmol/L (ref 98–111)
Creatinine, Ser: 0.77 mg/dL (ref 0.44–1.00)
GFR, Estimated: >60 mL/min (ref >60)
Glucose, Bld: 113 mg/dL — ABNORMAL HIGH (ref 70–99)
Potassium: 5.1 mmol/L (ref 3.5–5.1)
Sodium: 139 mmol/L (ref 135–145)
Total Bilirubin: 0.7 mg/dL (ref 0.0–1.2)
Total Protein: 7.3 g/dL (ref 6.5–8.1)

## 2023-11-28 LAB — CBC WITH DIFFERENTIAL/PLATELET
Abs Immature Granulocytes: 0.01 K/uL (ref 0.00–0.07)
Basophils Absolute: 0 K/uL (ref 0.0–0.1)
Basophils Relative: 1 %
Eosinophils Absolute: 0.1 K/uL (ref 0.0–0.5)
Eosinophils Relative: 1 %
HCT: 40.5 % (ref 36.0–46.0)
Hemoglobin: 14 g/dL (ref 12.0–15.0)
Immature Granulocytes: 0 %
Lymphocytes Relative: 45 %
Lymphs Abs: 2.7 K/uL (ref 0.7–4.0)
MCH: 31.1 pg (ref 26.0–34.0)
MCHC: 34.6 g/dL (ref 30.0–36.0)
MCV: 90 fL (ref 80.0–100.0)
Monocytes Absolute: 0.3 K/uL (ref 0.1–1.0)
Monocytes Relative: 5 %
Neutro Abs: 2.9 K/uL (ref 1.7–7.7)
Neutrophils Relative %: 48 %
Platelets: 121 K/uL — ABNORMAL LOW (ref 150–400)
RBC: 4.5 MIL/uL (ref 3.87–5.11)
RDW: 12.1 % (ref 11.5–15.5)
WBC: 6 K/uL (ref 4.0–10.5)
nRBC: 0 % (ref 0.0–0.2)

## 2023-11-28 LAB — POCT URINE DRUG SCREEN - MANUAL ENTRY (I-SCREEN)
POC Amphetamine UR: NOT DETECTED
POC Buprenorphine (BUP): NOT DETECTED
POC Cocaine UR: NOT DETECTED
POC Marijuana UR: POSITIVE — AB
POC Methadone UR: NOT DETECTED
POC Methamphetamine UR: NOT DETECTED
POC Morphine: NOT DETECTED
POC Oxazepam (BZO): NOT DETECTED
POC Oxycodone UR: NOT DETECTED
POC Secobarbital (BAR): NOT DETECTED

## 2023-11-28 LAB — TSH: TSH: 0.102 u[IU]/mL — ABNORMAL LOW (ref 0.350–4.500)

## 2023-11-28 LAB — POC URINE PREG, ED: Preg Test, Ur: NEGATIVE

## 2023-11-28 LAB — ETHANOL: Alcohol, Ethyl (B): 259 mg/dL — ABNORMAL HIGH (ref <15)

## 2023-11-28 MED ORDER — MAGNESIUM HYDROXIDE 400 MG/5ML PO SUSP: 30.0000 mL | Freq: Every day | ORAL | Status: DC | PRN

## 2023-11-28 MED ORDER — ALUM & MAG HYDROXIDE-SIMETH 200-200-20 MG/5ML PO SUSP: 30.0000 mL | ORAL | Status: DC | PRN

## 2023-11-28 MED ORDER — LEVOTHYROXINE SODIUM 100 MCG PO TABS
200.0000 ug | ORAL_TABLET | Freq: Every day | ORAL | Status: DC
  Administered 2023-11-29 – 2023-12-05 (×7): 200 ug via ORAL
  Filled 2023-11-28 (×4): qty 2
  Filled 2023-11-28: qty 28
  Filled 2023-11-28 (×3): qty 2

## 2023-11-28 MED ORDER — TRAZODONE HCL 50 MG PO TABS
50.0000 mg | ORAL_TABLET | Freq: Every evening | ORAL | Status: DC | PRN
  Administered 2023-11-29 – 2023-12-04 (×6): 50 mg via ORAL
  Filled 2023-11-28 (×2): qty 1
  Filled 2023-11-28: qty 14
  Filled 2023-11-28 (×4): qty 1

## 2023-11-28 MED ORDER — ACETAMINOPHEN 325 MG PO TABS: 650.0000 mg | ORAL_TABLET | Freq: Four times a day (QID) | ORAL | Status: DC | PRN

## 2023-11-28 MED ORDER — SERTRALINE HCL 50 MG PO TABS
50.0000 mg | ORAL_TABLET | Freq: Every day | ORAL | Status: DC
  Administered 2023-11-29 – 2023-12-05 (×7): 50 mg via ORAL
  Filled 2023-11-28 (×3): qty 1
  Filled 2023-11-28: qty 14
  Filled 2023-11-28 (×4): qty 1

## 2023-11-28 MED ORDER — HYDROXYZINE HCL 25 MG PO TABS
25.0000 mg | ORAL_TABLET | Freq: Four times a day (QID) | ORAL | Status: DC | PRN
  Administered 2023-12-05: 25 mg via ORAL
  Filled 2023-11-28: qty 1

## 2023-11-28 MED ORDER — METFORMIN HCL ER 500 MG PO TB24
500.0000 mg | ORAL_TABLET | Freq: Every day | ORAL | Status: DC
  Administered 2023-11-29 – 2023-12-05 (×7): 500 mg via ORAL
  Filled 2023-11-28: qty 14
  Filled 2023-11-28 (×7): qty 1

## 2023-11-28 MED ORDER — OLANZAPINE 10 MG IM SOLR: 5.0000 mg | Freq: Three times a day (TID) | INTRAMUSCULAR | Status: DC | PRN

## 2023-11-28 MED ORDER — HALOPERIDOL 5 MG PO TABS: 5.0000 mg | ORAL_TABLET | Freq: Three times a day (TID) | ORAL | Status: DC | PRN

## 2023-11-28 MED ORDER — DIPHENHYDRAMINE HCL 50 MG PO CAPS: 50.0000 mg | ORAL_CAPSULE | Freq: Three times a day (TID) | ORAL | Status: DC | PRN

## 2023-11-28 MED ORDER — OLANZAPINE 10 MG IM SOLR: 10.0000 mg | Freq: Three times a day (TID) | INTRAMUSCULAR | Status: DC | PRN
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
## 2023-11-28 NOTE — ED Notes (Signed)
 Patient is sleeping. Respirations equal and unlabored. No change in assessment or acuity. Routine safety checks conducted according to facility protocol.

## 2023-11-28 NOTE — ED Notes (Addendum)
 Patient advocate observed patient lying on the floor. There is a support person in the room. Medical entered room to assist patient.   Cynthia [name] 11/28/23 1451    Cynthia [name] 11/28/23 1451

## 2023-11-28 NOTE — ED Notes (Signed)
 Patient occasionally getting out of the bed and unsteady on her feet. Patient laying on floor and has to be asked to get back in the bed then would scream and cry. When asked what is bothering her she would apologize and say she is just being human.    Rosina LOISE Ronde, EMT-P 11/28/23 [phone]
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
## 2023-11-28 NOTE — Group Note (Signed)
 Group Topic: Relapse and Recovery  Group Date: 11/28/2023 Start Time: 1930 End Time: 2000 Facilitators: Verdon Jacqualyn BRAVO, NT  Department: [facility]  Number of Participants: 6  Group Focus: self-awareness Treatment Modality:  Individual Therapy Interventions utilized were exploration Purpose: trigger / craving management  Name: Cynthia [name] Date of Birth: 08-01-1990  MR: [id number]    Level of Participation: none  Quality of Participation: n/a Interactions with others: n/a Mood/Affect: n/a Triggers (if applicable): n/a Cognition: n/a Progress: Other Response: n/a Plan: patient will be encouraged to attend group  Patients Problems:  Patient Active Problem List   Diagnosis Date Noted   Alcohol abuse 11/28/2023   DM (diabetes mellitus), type 2 (HCC) 09/09/2017   ETOH abuse 09/09/2017   Lactic acidosis 09/09/2017   Toxic encephalopathy 09/09/2017   MDD (major depressive disorder), recurrent episode, severe (HCC) 09/09/2017   MDD (major depressive disorder), recurrent severe, without psychosis (HCC)    Overdose 09/07/2017   Altered mental status    S/P total thyroidectomy 09/24/2015
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
## 2023-11-29 DIAGNOSIS — Z7984 Long term (current) use of oral hypoglycemic drugs: Secondary | ICD-10-CM | POA: Diagnosis not present

## 2023-11-29 DIAGNOSIS — Z79890 Hormone replacement therapy (postmenopausal): Secondary | ICD-10-CM | POA: Diagnosis not present

## 2023-11-29 DIAGNOSIS — F1010 Alcohol abuse, uncomplicated: Secondary | ICD-10-CM | POA: Diagnosis not present

## 2023-11-29 DIAGNOSIS — F339 Major depressive disorder, recurrent, unspecified: Secondary | ICD-10-CM | POA: Diagnosis not present

## 2023-11-29 MED ORDER — CHLORDIAZEPOXIDE HCL 5 MG PO CAPS
5.0000 mg | ORAL_CAPSULE | Freq: Three times a day (TID) | ORAL | Status: AC
  Administered 2023-12-01 – 2023-12-02 (×3): 5 mg via ORAL
  Filled 2023-11-29 (×3): qty 1

## 2023-11-29 MED ORDER — CHLORDIAZEPOXIDE HCL 5 MG PO CAPS
5.0000 mg | ORAL_CAPSULE | Freq: Every day | ORAL | Status: AC
  Administered 2023-12-02: 5 mg via ORAL
  Filled 2023-11-29: qty 1

## 2023-11-29 MED ORDER — LOPERAMIDE HCL 2 MG PO CAPS: 2.0000 mg | ORAL_CAPSULE | ORAL | Status: AC | PRN

## 2023-11-29 MED ORDER — ONDANSETRON 4 MG PO TBDP: 4.0000 mg | ORAL_TABLET | Freq: Four times a day (QID) | ORAL | Status: AC | PRN

## 2023-11-29 MED ORDER — THIAMINE HCL 100 MG/ML IJ SOLN: 100.0000 mg | Freq: Once | INTRAMUSCULAR | Status: DC

## 2023-11-29 MED ORDER — THIAMINE MONONITRATE 100 MG PO TABS
100.0000 mg | ORAL_TABLET | Freq: Every day | ORAL | Status: DC
  Administered 2023-11-30 – 2023-12-05 (×6): 100 mg via ORAL
  Filled 2023-11-29 (×6): qty 1

## 2023-11-29 MED ORDER — LORAZEPAM 1 MG PO TABS: 1.0000 mg | ORAL_TABLET | Freq: Four times a day (QID) | ORAL | Status: AC | PRN

## 2023-11-29 MED ORDER — HYDROXYZINE HCL 25 MG PO TABS
25.0000 mg | ORAL_TABLET | Freq: Four times a day (QID) | ORAL | Status: AC | PRN
  Administered 2023-11-29 (×2): 25 mg via ORAL
  Filled 2023-11-29: qty 1

## 2023-11-29 MED ORDER — ADULT MULTIVITAMIN W/MINERALS CH
1.0000 | ORAL_TABLET | Freq: Every day | ORAL | Status: DC
  Administered 2023-11-29 – 2023-12-05 (×7): 1 via ORAL
  Filled 2023-11-29 (×7): qty 1

## 2023-11-29 MED ORDER — CHLORDIAZEPOXIDE HCL 25 MG PO CAPS
25.0000 mg | ORAL_CAPSULE | Freq: Three times a day (TID) | ORAL | Status: AC
  Administered 2023-11-29 – 2023-11-30 (×4): 25 mg via ORAL
  Filled 2023-11-29 (×4): qty 1

## 2023-11-29 MED ORDER — ENSURE PLUS HIGH PROTEIN PO LIQD
237.0000 mL | Freq: Two times a day (BID) | ORAL | Status: DC
  Administered 2023-11-29 – 2023-12-04 (×6): 237 mL via ORAL

## 2023-11-29 MED ORDER — CHLORDIAZEPOXIDE HCL 5 MG PO CAPS
10.0000 mg | ORAL_CAPSULE | Freq: Three times a day (TID) | ORAL | Status: AC
  Administered 2023-11-30 – 2023-12-01 (×3): 10 mg via ORAL
  Filled 2023-11-29 (×3): qty 2

## 2023-11-29 MED ORDER — PANTOPRAZOLE SODIUM 40 MG PO TBEC
40.0000 mg | DELAYED_RELEASE_TABLET | Freq: Every day | ORAL | Status: DC
  Administered 2023-11-29 – 2023-12-05 (×7): 40 mg via ORAL
  Filled 2023-11-29 (×6): qty 1
  Filled 2023-11-29: qty 14
  Filled 2023-11-29: qty 1

## 2023-11-29 NOTE — ED Notes (Signed)
 Pt observed lying in bed. Eyes closed respirations even and non labored. NAD q 15 minute observations continue for safety.

## 2023-11-29 NOTE — ED Notes (Addendum)
 Got patient's underwear, and house shoes her sister brought. They were put in the patient's locker. She was allowed the house shoes and the underwear, the brush was left in her locker.

## 2023-11-29 NOTE — ED Notes (Signed)
 Pt is sleeping, no acute distress noted.

## 2023-11-29 NOTE — ED Notes (Signed)
 Pt noted to be group attendance.  Pt tremulous. Reports nausea/vomiting that she associates with gastroparesis.  Pt is being monitored via CIWA protocol.  Scores were 4 and 4, Pt started on librium  taper and is tolerating medications at present Q 15 minute observations for safety continue

## 2023-11-29 NOTE — Group Note (Signed)
 Group Topic: Relapse and Recovery  Group Date: 11/29/2023 Start Time: 1930 End Time: 2030 Facilitators: Joan Plowman B  Department: [facility]  Number of Participants: 3  Group Focus: abuse issues, community group, and substance abuse education Treatment Modality:  Individual Therapy, Psychoeducation, and Spiritual Interventions utilized were group exercise, leisure development, patient education, and support Purpose: enhance coping skills, express feelings, increase insight, relapse prevention strategies, and trigger / craving management  Name: Cynthia [name] Date of Birth: 10-Feb-1991  MR: [id number]    Level of Participation: active Quality of Participation: attentive and cooperative Interactions with others: gave feedback Mood/Affect: appropriate Triggers (if applicable): NA Cognition: coherent/clear Progress: Gaining insight Response: NA Plan: patient will be encouraged to keep going to groups  Patients Problems:  Patient Active Problem List   Diagnosis Date Noted   Alcohol abuse 11/28/2023   DM (diabetes mellitus), type 2 (HCC) 09/09/2017   Lactic acidosis 09/09/2017   Toxic encephalopathy 09/09/2017   MDD (major depressive disorder), recurrent severe, without psychosis (HCC)    Overdose 09/07/2017   Altered mental status    S/P total thyroidectomy 09/24/2015

## 2023-11-29 NOTE — ED Notes (Signed)
 Pt reports nausea related to gastroparesis.  She states that when she feels like food is sitting on her stomach too long, she will try and throw up to get relief.  Informed patient that she does have Zofran  available prn when needed,  Pt verbalized understanding

## 2023-11-29 NOTE — Group Note (Signed)
 Group Topic: Relapse and Recovery  Group Date: 11/29/2023 Start Time: 0800 End Time: 0830 Facilitators: Lonzell Dwayne RAMAN, NT  Department: [facility]  Number of Participants: 3  Group Focus: co-dependency Treatment Modality:  Patient-Centered Therapy Interventions utilized were patient education Purpose: express irrational fears  Name: Cynthia [name] Date of Birth: 1990/11/12  MR: [id number]    Level of Participation: Patient did attend groupactive Quality of Participation: hyperactive Interactions with others: gave feedback Mood/Affect: positive Triggers (if applicable): N/A Cognition: insightful Progress: Minimal Response: Appropriate  Plan: patient will be encouraged to stay calm, relax and continue to be upbeat and positive.  Patients Problems:  Patient Active Problem List   Diagnosis Date Noted   Alcohol abuse 11/28/2023   DM (diabetes mellitus), type 2 (HCC) 09/09/2017   Lactic acidosis 09/09/2017   Toxic encephalopathy 09/09/2017   MDD (major depressive disorder), recurrent severe, without psychosis (HCC)    Overdose 09/07/2017   Altered mental status    S/P total thyroidectomy 09/24/2015
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
## 2023-11-29 NOTE — Group Note (Signed)
 Group Topic: Communication  Group Date: 11/29/2023 Start Time: 1730 End Time: 1750 Facilitators: Carletha Iha, RN  Department: [facility]  Number of Participants: 5  Group Focus: psychiatric education Treatment Modality:  Psychoeducation Interventions utilized were patient education Purpose: relapse prevention strategies  Name: Cynthia [name] Date of Birth: 1990/11/05  MR: [id number]    Level of Participation: moderate Quality of Participation: cooperative Interactions with others: gave feedback Mood/Affect: appropriate Triggers (if applicable):  Cognition: processing slowly Progress: Moderate Response:  Plan: patient will be encouraged to continue to take medications after discharge  Patients Problems:  Patient Active Problem List   Diagnosis Date Noted   Alcohol abuse 11/28/2023   DM (diabetes mellitus), type 2 (HCC) 09/09/2017   Lactic acidosis 09/09/2017   Toxic encephalopathy 09/09/2017   MDD (major depressive disorder), recurrent severe, without psychosis (HCC)    Overdose 09/07/2017   Altered mental status    S/P total thyroidectomy 09/24/2015

## 2023-11-29 NOTE — [facility] Group Notes (Signed)
 Spirituality Group   Description: Participant directed exploration of values, beliefs and meaning   Following a brief framework of chaplain's role and ground rules of group behavior, participants are invited to share concerns or questions that engage spiritual life. Emphasis placed on common themes and shared experiences and ways to make meaning and clarify living into one's values.   Theory/Process/Goal: Utilize the theoretical framework of group therapy established by Celena Kite, Relational Cultural Theory and Rogerian approaches to facilitate relational empathy and use of the "here and now" to foster reflection, self-awareness, and sharing.   Observations: Cynthia [name] expressed views on what is meaningful to her, named strengths, and identified some needs/goals (community/attend future outpatient groups, volunteer).  Tannar Broker L. Delores HERO.Div

## 2023-11-30 DIAGNOSIS — Z7984 Long term (current) use of oral hypoglycemic drugs: Secondary | ICD-10-CM | POA: Diagnosis not present

## 2023-11-30 DIAGNOSIS — Z79890 Hormone replacement therapy (postmenopausal): Secondary | ICD-10-CM | POA: Diagnosis not present

## 2023-11-30 DIAGNOSIS — F339 Major depressive disorder, recurrent, unspecified: Secondary | ICD-10-CM | POA: Diagnosis not present

## 2023-11-30 DIAGNOSIS — F1010 Alcohol abuse, uncomplicated: Secondary | ICD-10-CM | POA: Diagnosis not present

## 2023-11-30 NOTE — ED Notes (Signed)
 Patient has been awake and alert throughout the day taking short naps at times.  Patient is childlike but pleasant and organized.  She attended AA meeting and nursing group held today and participated well.  Patient makes needs known and is without distress or withdrawal at this time.  Will monitor.

## 2023-11-30 NOTE — Group Note (Signed)
 Group Topic: Relapse and Recovery  Group Date: 11/30/2023 Start Time: 2000 End Time: 2100 Facilitators: Joan Plowman B  Department: [facility]  Number of Participants: 3  Group Focus: abuse issues and substance abuse education Treatment Modality:  Individual Therapy Interventions utilized were leisure development Purpose: relapse prevention strategies  Name: KANYLAH MUENCH Date of Birth: January 04, 1991  MR: [id number]    Level of Participation: active Quality of Participation: attentive, cooperative, and initiates communication Interactions with others: gave feedback Mood/Affect: appropriate, brightens with interaction, and positive Triggers (if applicable): NA Cognition: coherent/clear Progress: Gaining insight Response: she is hopeful and wants the help.  Plan: patient will be encouraged to keep going to groups  Patients Problems:  Patient Active Problem List   Diagnosis Date Noted   Alcohol abuse 11/28/2023   DM (diabetes mellitus), type 2 (HCC) 09/09/2017   Lactic acidosis 09/09/2017   Toxic encephalopathy 09/09/2017   MDD (major depressive disorder), recurrent severe, without psychosis (HCC)    Overdose 09/07/2017   Altered mental status    S/P total thyroidectomy 09/24/2015

## 2023-11-30 NOTE — Discharge Planning (Signed)
 SW assisted with scheduled IOP appointment upstairs on 9/10 @ 8:30 A.M. Patient has instructions provided in her AVS upon DC which is anticipated possibly on Friday. Will continue to follow.

## 2023-11-30 NOTE — ED Notes (Signed)
 Patient is awake and alert on unit without issue or complaint.  No distress.  Will monitor.

## 2023-11-30 NOTE — ED Notes (Signed)
 Pt is sleeping, no acute distress noted.

## 2023-11-30 NOTE — ED Notes (Signed)
 Patient is sleeping. Respirations equal and unlabored. No change in assessment or acuity. Routine safety checks conducted according to facility protocol.

## 2023-11-30 NOTE — Discharge Instructions (Addendum)
 Cynthia [name] will be discharging to [facility] on Monday 9/8 @ 9:00am with transportation provided via Taxi needing to arrive by 8:15 am for pick up.  Address is [address] [place], [place] [address]. Number to call for emergency is [phone]. Update has been provided to the Cynthia [name] and MD made aware. Cynthia [name] will need a 14day supply of medication and one month refill. No nicotine gum allowed, however patches are if ordered. No other needs to report at this time.   [name], LCSW Clinical Social Worker [place] Ph: [phone]    [facility] [address], SECOND FLOOR [place], [place] [address] [phone]                   [facility]-I[facility]   Specialized Group Therapy for Substance Abuse If you have a substance abuse disorder (with or without a mental health condition), you may benefit from specialized substance abuse therapy in a group setting. According to discharge survey data, 70 percent of patients completing our chemical dependency intensive outpatient program report fewer symptoms of substance abuse and incidents of relapse.  Under the guidance of a licensed mental health professional, meet with your peers every Monday, Wednesday and Friday from 9 a.m. to 12 p.m. for 6 to 8 weeks to:  Learn about chemical dependency, mental illness and co-occurring disorders. Develop relapse-prevention skills. Set personalized goals with your treatment team. To build on the skills you gain, you can attend Alcoholics Anonymous or Narcotics Anonymous meetings in the evenings and access follow-up care through weekly group meetings with peers. Ongoing support promotes wellness and recovery.  For more information, call [name], LCSW at [phone]. We work directly with employers and families to ensure you receive the care you need.         ..  Based on the information that you have provided and the presenting issues  the following homeless shelter have been provided below.  In case of an urgent crisis, you may contact the Mobile Crisis Unit with [facility] at [phone].              Homeless Shelters in [facility] [facility] NIGHT SHELTER) [address] [place], [place] Phone: [phone]   [facility] has been providing emergency shelter to those in need of a permanent residence for over 35 years. The [employer] shelter plays an important role in our community.   There are many life events that can pull someone into a downward spiral towards poverty that is very difficult to get out of. Homelessness is a problem that can affect anyone of us . [employer] is a safe and comforting place to stay, especially if you have experienced the hardship of street life.   [employer] provides a single bed and bedding to 100 adult men and women. The shelter welcomes all who are in need of housing, no one in real need is turned away unless space is not available.   While staying at [facility], guests are offered more than just a bed for a night. Hot meals are provided and every guest has access to case management services. Case managers provide assistance with finding housing, employment, or other services that will help them gain stability. Continuous stay is based on availability, capacity, and progress towards goals.   To contact the front desk of [facility] please call  [phone] ext  [phone] or ext. [phone].      The [facility] AND CHILDREN) [address] [place], [place]  [address] [phone]       [facility] [address] [place], [place] [address] Phone: [phone]     [facility]  [address], [place], [place]  [address] [phone] Population served: Female veterans 18+ with substance  abuse/mental health issues Eligibility: By referral only     [facility] (Women only) [address] [place], [place] [address] Phone: [phone] Population served: Single women 18+ without dependents Documents required:  Valid ID & Social Security Card       The [employer] Address: [address], [place], [place] [address] Hours:   Opens 9?AM Mon Phone: [phone]  The [facility] providers a continuum of housing services to those experiencing homelessness. They provide transitional [employer] and permanent supportive housing ([employer]) to disabled veterans experiencing homelessness. There is a fast track Rapid Rehousing program couples housing stability services with temporary financial assistance to quickly house individuals and families experiencing homelessness.   [employer] [address] [place], [place] [address] Phone: [phone]      [facility] [address] [place], [place] [address] Phone: [phone]  Offers food and emergency or transitional housing to men, women, or families in need. Clients participate in programs and workshops developed to promote self-sufficiency and personal development. Call or walk in. Applications are accepted Monday, Wednesday, and Friday by appointment only. Need photo ID and proof of income    [employer] [facility] [employer] [address], [place], [place] [address] [phone] Population served: Men 18+, preference for disabled and/or veterans    [facility]  [address] [place], [place], [place] [address] [phone] Population served: Families with children.      [facility] [place] [facility] [employer]. [address], [place] [address] [phone] or [phone] Population served: Pregnant women with or without children       [employer] [facility] [address] [place], [place] [address] Check in at 6:00PM for placement at a local shelter) Phone: [phone]   [facility] Eligibility:  Must be drug and alcohol free for at least 14 days or more at the time of application. This program serves males.  Houses Engineer, civil (consulting), Economist who serve six-month terms.  Houses are financially self-supporting; members split house expenses, which average $90.00 to $130.00 per person per week.  Any Resident who relapses must be immediately expelled. Call:  [phone]   [facility] Address: [address] [place], [place] [address]  Phone#: [phone]    [facility] Address: [address] [place], [place] [address] Phone: [phone]  -The [facility] provides food, shelter and other programs and services to the homeless men of [place] through our [employer] program.  By offering safe shelter, three meals a day, clean clothing, Biblical counseling, financial planning, vocational training, GED/education and employment assistance, we've helped mend the shattered lives of many homeless men since opening in [place].  We have approximately 267 beds available, with a max of 312 beds including mats for emergency situations and currently house an average of 270 men a night.  Prospective Client Check-In Information Photo ID Required (  State/ Out of State/ [facility]) - if photo ID is not available, clients are required to have a printout of a police/sheriff's criminal history report. Help out with chores around the [facility]. No sex offender of any type (pending, charged, registered and/or any other sex related offenses) will be permitted to check in. Must be willing to abide by all rules, regulations, and policies established by the [employer]. The following will be provided - shelter, food, clothing, and biblical counseling. If you or someone you know is in  need of assistance at our [facility] shelter in [place], [place], please call [phone] ext. [phone].  Women Shelter for [employer] hours are Monday-Friday only.    [facility]          -(Please Call) Phone: [phone]   Provides housing and specialized case management focused on housing stability.

## 2023-11-30 NOTE — ED Provider Notes (Signed)
 Behavioral Health Progress Note  Date and Time: 11/30/2023 3:30 PM Name: Cynthia [name] MRN:  [id number]  Subjective:  Cynthia [name] was seen in the common area on rounds today. She is in better spirits. She is still have some tremor but otherwise denies withdrawal symptoms. She feels that she is overall better today. She is sleeping and eating okay. Her broken nose is much better. No hallucinations, thoughts of harm to self or others.   Diagnosis:  Final diagnoses:  Alcohol abuse  Recurrent major depressive disorder, remission status unspecified (HCC)    Total Time spent with patient: 15 minutes  Past Psychiatric History: Alcohol abuse, MDD, GAD Past Medical History: See chart Family History: Unknown Social History: Alcohol, delta 8  Sleep: Fair  Appetite:  Fair  Current Medications:  Current Facility-Administered Medications  Medication Dose Route Frequency Provider Last Rate Last Admin   acetaminophen  (TYLENOL ) tablet 650 mg  650 mg Oral Q6H PRN Trudy Carwin, NP       alum & mag hydroxide-simeth (MAALOX/MYLANTA) 200-200-20 MG/5ML suspension 30 mL  30 mL Oral Q4H PRN Trudy Carwin, NP       chlordiazePOXIDE  (LIBRIUM ) capsule 25 mg  25 mg Oral TID Leigh Corean Massa, MD   25 mg at 11/30/23 9060   Followed by   chlordiazePOXIDE  (LIBRIUM ) capsule 10 mg  10 mg Oral TID Leigh Corean Massa, MD       Followed by   NOREEN ON 12/01/2023] chlordiazePOXIDE  (LIBRIUM ) capsule 5 mg  5 mg Oral TID Leigh Corean Massa, MD       Followed by   NOREEN ON 12/02/2023] chlordiazePOXIDE  (LIBRIUM ) capsule 5 mg  5 mg Oral QHS Chennel Olivos, Corean Massa, MD       haloperidol  (HALDOL ) tablet 5 mg  5 mg Oral TID PRN Trudy Carwin, NP       And   diphenhydrAMINE  (BENADRYL ) capsule 50 mg  50 mg Oral TID PRN Trudy Carwin, NP       feeding supplement (ENSURE PLUS HIGH PROTEIN) liquid 237 mL  237 mL Oral BID BM Carry Ortez, Corean Massa, MD   Given at 11/30/23 0856   hydrOXYzine  (ATARAX ) tablet 25 mg  25 mg Oral  Q6H PRN Trudy Carwin, NP       hydrOXYzine  (ATARAX ) tablet 25 mg  25 mg Oral Q6H PRN Trudy Carwin, NP   25 mg at 11/29/23 2109   levothyroxine  (SYNTHROID ) tablet 200 mcg  200 mcg Oral Q0600 Trudy Carwin, NP   200 mcg at 11/30/23 9473   loperamide  (IMODIUM ) capsule 2-4 mg  2-4 mg Oral PRN Trudy Carwin, NP       LORazepam  (ATIVAN ) tablet 1 mg  1 mg Oral Q6H PRN Trudy Carwin, NP       magnesium  hydroxide (MILK OF MAGNESIA) suspension 30 mL  30 mL Oral Daily PRN Trudy Carwin, NP       metFORMIN  (GLUCOPHAGE -XR) 24 hr tablet 500 mg  500 mg Oral Q breakfast Trudy Carwin, NP   500 mg at 11/30/23 9143   multivitamin with minerals tablet 1 tablet  1 tablet Oral Daily Trudy Carwin, NP   1 tablet at 11/30/23 9060   OLANZapine  (ZYPREXA ) injection 10 mg  10 mg Intramuscular TID PRN Trudy Carwin, NP       OLANZapine  (ZYPREXA ) injection 5 mg  5 mg Intramuscular TID PRN Trudy Carwin, NP       ondansetron  (ZOFRAN -ODT) disintegrating tablet 4 mg  4 mg Oral Q6H PRN Trudy Carwin, NP  pantoprazole  (PROTONIX ) EC tablet 40 mg  40 mg Oral Daily Keyera Hattabaugh, Corean Massa, MD   40 mg at 11/30/23 9060   sertraline  (ZOLOFT ) tablet 50 mg  50 mg Oral Daily Trudy Carwin, NP   50 mg at 11/30/23 9060   thiamine  (VITAMIN B1) injection 100 mg  100 mg Intramuscular Once Trudy Carwin, NP       thiamine  (VITAMIN B1) tablet 100 mg  100 mg Oral Daily Trudy Carwin, NP   100 mg at 11/30/23 9060   traZODone  (DESYREL ) tablet 50 mg  50 mg Oral QHS PRN Trudy Carwin, NP   50 mg at 11/29/23 2109   Current Outpatient Medications  Medication Sig Dispense Refill   calcium carbonate (TUMS - DOSED IN MG ELEMENTAL CALCIUM) 500 MG chewable tablet Chew 3 tablets by mouth daily as needed for indigestion or heartburn.     FLUoxetine (PROZAC) 20 MG capsule Take 20 mg by mouth daily. Take with a 40mg  capsule for a total of 60mg      FLUoxetine (PROZAC) 40 MG capsule Take 40 mg by mouth daily. Take with a 20mg  capsule for a total of 60mg       glipiZIDE (GLUCOTROL XL) 10 MG 24 hr tablet Take 10 mg by mouth daily.     hydrOXYzine  (ATARAX /VISTARIL ) 25 MG tablet Take 1 tablet (25 mg) by mouth four times daily as needed: For anxiety (Patient taking differently: Take 25 mg by mouth 3 (three) times daily.) 60 tablet 0   levothyroxine  (SYNTHROID ) 125 MCG tablet Take 250 mcg by mouth daily before breakfast.     omeprazole (PRILOSEC) 40 MG capsule Take 40 mg by mouth daily.     rosuvastatin (CRESTOR) 10 MG tablet Take 10 mg by mouth daily.      Labs  Lab Results:  Admission on 11/28/2023  Component Date Value Ref Range Status   WBC 11/28/2023 6.0  4.0 - 10.5 K/uL Final   RBC 11/28/2023 4.50  3.87 - 5.11 MIL/uL Final   Hemoglobin 11/28/2023 14.0  12.0 - 15.0 g/dL Final   HCT 90/98/7974 40.5  36.0 - 46.0 % Final   MCV 11/28/2023 90.0  80.0 - 100.0 fL Final   MCH 11/28/2023 31.1  26.0 - 34.0 pg Final   MCHC 11/28/2023 34.6  30.0 - 36.0 g/dL Final   RDW 90/98/7974 12.1  11.5 - 15.5 % Final   Platelets 11/28/2023 121 (L)  150 - 400 K/uL Final   nRBC 11/28/2023 0.0  0.0 - 0.2 % Final   Neutrophils Relative % 11/28/2023 48  % Final   Neutro Abs 11/28/2023 2.9  1.7 - 7.7 K/uL Final   Lymphocytes Relative 11/28/2023 45  % Final   Lymphs Abs 11/28/2023 2.7  0.7 - 4.0 K/uL Final   Monocytes Relative 11/28/2023 5  % Final   Monocytes Absolute 11/28/2023 0.3  0.1 - 1.0 K/uL Final   Eosinophils Relative 11/28/2023 1  % Final   Eosinophils Absolute 11/28/2023 0.1  0.0 - 0.5 K/uL Final   Basophils Relative 11/28/2023 1  % Final   Basophils Absolute 11/28/2023 0.0  0.0 - 0.1 K/uL Final   Immature Granulocytes 11/28/2023 0  % Final   Abs Immature Granulocytes 11/28/2023 0.01  0.00 - 0.07 K/uL Final   Performed at [facility] Lab, [address] [place], [place] [address]   Sodium 11/28/2023 139  135 - 145 mmol/L Final   Potassium 11/28/2023 5.1  3.5 - 5.1 mmol/L Final   Chloride 11/28/2023 100  98 - 111 mmol/L Final   CO2 11/28/2023 24   22 - 32 mmol/L Final   Glucose, Bld 11/28/2023 113 (H)  70 - 99 mg/dL Final   Glucose reference range applies only to samples taken after fasting for at least 8 hours.   BUN 11/28/2023 <5 (L)  6 - 20 mg/dL Final   Creatinine, Ser 11/28/2023 0.77  0.44 - 1.00 mg/dL Final   Calcium 90/98/7974 8.9  8.9 - 10.3 mg/dL Final   Total Protein 90/98/7974 7.3  6.5 - 8.1 g/dL Final   Albumin 90/98/7974 3.8  3.5 - 5.0 g/dL Final   AST 90/98/7974 163 (H)  15 - 41 U/L Final   ALT 11/28/2023 93 (H)  0 - 44 U/L Final   Alkaline Phosphatase 11/28/2023 88  38 - 126 U/L Final   Total Bilirubin 11/28/2023 0.7  0.0 - 1.2 mg/dL Final   GFR, Estimated 11/28/2023 >60  >60 mL/min Final   Comment: (NOTE) Calculated using the CKD-EPI Creatinine Equation (2021)    Anion gap 11/28/2023 15  5 - 15 Final   Performed at [facility], [address] [place], [place] [address]   Alcohol, Ethyl (B) 11/28/2023 259 (H)  <15 mg/dL Final   Comment: (NOTE) For medical purposes only. Performed at [facility], [address] [place], [place] [address]    TSH 11/28/2023 0.102 (L)  0.350 - 4.500 uIU/mL Final   Comment: Performed by a 3rd Generation assay with a functional sensitivity of <=0.01 uIU/mL. Performed at [facility], [address] [place], [place] [address]    Preg Test, Ur 11/28/2023 Negative  Negative Final   POC Amphetamine UR 11/28/2023 None Detected  NONE DETECTED (Cut Off Level 1000 ng/mL) Final   POC Secobarbital (BAR) 11/28/2023 None Detected  NONE DETECTED (Cut Off Level 300 ng/mL) Final   POC Buprenorphine (BUP) 11/28/2023 None Detected  NONE DETECTED (Cut Off Level 10 ng/mL) Final   POC Oxazepam (BZO) 11/28/2023 None Detected  NONE DETECTED (Cut Off Level 300 ng/mL) Final   POC Cocaine UR 11/28/2023 None Detected  NONE DETECTED (Cut Off Level 300 ng/mL) Final   POC Methamphetamine UR 11/28/2023 None Detected  NONE DETECTED (Cut Off Level 1000 ng/mL) Final   POC Morphine 11/28/2023  None Detected  NONE DETECTED (Cut Off Level 300 ng/mL) Final   POC Methadone UR 11/28/2023 None Detected  NONE DETECTED (Cut Off Level 300 ng/mL) Final   POC Oxycodone  UR 11/28/2023 None Detected  NONE DETECTED (Cut Off Level 100 ng/mL) Final   POC Marijuana UR 11/28/2023 Positive (A)  NONE DETECTED (Cut Off Level 50 ng/mL) Final    Blood Alcohol level:  Lab Results  Component Value Date   ETH 259 (H) 11/28/2023   ETH 121 (H) 09/07/2017    Metabolic Disorder Labs: Lab Results  Component Value Date   HGBA1C 7.6 (H) 09/07/2017   MPG 171.42 09/07/2017   No results found for: PROLACTIN No results found for: CHOL, TRIG, HDL, CHOLHDL, VLDL, LDLCALC  Therapeutic Lab Levels: No results found for: LITHIUM No results found for: VALPROATE No results found for: CBMZ  Physical Findings   AIMS    Flowsheet Row [facility]  AIMS Total Score 0   AUDIT    Flowsheet Row [facility]  Alcohol Use Disorder Identification Test Final Score (AUDIT) 17   PHQ2-9  Flowsheet Row ED from 11/28/2023 in [facility]  PHQ-2 Total Score 5  PHQ-9 Total Score 14   Flowsheet Row ED from 11/28/2023 in [facility] Most recent reading at 11/28/2023 10:08 PM ED from 11/28/2023 in [facility] Most recent reading at 11/28/2023  6:59 PM ED from 05/19/2021 in [facility] Emergency Department at [facility] Most recent reading at 05/19/2021  8:18 PM  C-SSRS RISK CATEGORY Moderate Risk Moderate Risk No Risk     Musculoskeletal  Strength & Muscle Tone: within normal limits Gait & Station: normal Patient leans: N/A  Psychiatric Specialty Exam  Presentation  General Appearance:  Casual  Eye Contact: Good  Speech: Normal Rate  Speech  Volume: Normal  Handedness: Right   Mood and Affect  Mood: Dysphoric; Anxious  Affect: Constricted   Thought Process  Thought Processes: Linear  Descriptions of Associations:Intact  Orientation:Full (Time, Place and Person)  Thought Content:Logical  Diagnosis of Schizophrenia or Schizoaffective disorder in past: No    Hallucinations:Hallucinations: None  Ideas of Reference:None  Suicidal Thoughts:Suicidal Thoughts: No  Homicidal Thoughts:Homicidal Thoughts: No   Sensorium  Memory: Immediate Good; Recent Fair; Remote Fair  Judgment: Fair  Insight: Fair   Art therapist  Concentration: Fair  Attention Span: Fair  Recall: Good  Fund of Knowledge: Good  Language: Good   Psychomotor Activity  Psychomotor Activity: Psychomotor Activity: Tremor   Assets  Assets: Communication Skills; Desire for Improvement   Sleep  Sleep: Sleep: Poor  Estimated Sleeping Duration (Last 24 Hours): 10.75-13.50 hours  No data recorded  Physical Exam  Physical Exam Vitals and nursing note reviewed.  Constitutional:      Appearance: Normal appearance.  HENT:     Head: Normocephalic.  Eyes:     Extraocular Movements: Extraocular movements intact.  Pulmonary:     Effort: Pulmonary effort is normal.  Musculoskeletal:        General: Normal range of motion.     Cervical back: Normal range of motion.  Neurological:     Mental Status: She is alert and oriented to person, place, and time.  Psychiatric:        Behavior: Behavior normal.    Review of Systems  Constitutional:  Negative for chills and fever.  Gastrointestinal:  Negative for constipation, diarrhea, nausea and vomiting.  Genitourinary:  Negative for dysuria.  Musculoskeletal:  Negative for joint pain and myalgias.  Neurological:  Positive for tremors.  Psychiatric/Behavioral:  Negative for hallucinations and suicidal ideas.    Blood pressure 111/85, pulse (!) 102, temperature  97.7 F (36.5 C), temperature source Oral, resp. rate 18, SpO2 100%. There is no height or weight on file to calculate BMI.  Treatment Plan Summary: Long Term Goals: Improvement in symptoms so as ready for discharge   Short Term Goals: Patient will verbalize feelings in meetings with treatment team members., Patient will attend at least of 50% of the groups daily., Pt will complete the PHQ9 on admission, day 3 and discharge., and Patient will take medications as prescribed daily.   Medications: Mood/anxiety: continue group therapy, milieu therapy, 1:1 evaluation with provider.  Medication management: zoloft  50mg  PO daily for mood and anxiety  Substance Abuse:  brief intervention provided abstinence advised.  Severe alcohol use disorder: replacing thiamine . Encourage adequate PO intake. May require lab monitoring of electrolytes. Benzodiazepine taper as tolerated. Seizure precautions. Monitor for delirium tremens.  Nicotine and tobacco use: N/A Medical: PRNs for pain, constipation,  indigestion available.  Labs/studies: no new labs Safety and Monitoring: voluntarily admission to [facility]/Facility based care unit [facility]) unit for safety, stabilization and treatment Daily contact with patient to assess and evaluate symptoms and progress in treatment Patient's case to be discussed in multi-disciplinary team meeting Observation Level : q15 minute checks Vital signs: q12 hours Precautions: withdrawal and seizure   Based on my evaluation the patient does not appear to have an emergency medical condition.   Corean Anette Potters, MD 11/30/2023 3:30 PM

## 2023-11-30 NOTE — Group Note (Signed)
 Group Topic: Balance in Life  Group Date: 11/30/2023 Start Time: 1210 End Time: 1245 Facilitators: Stanly Stabile, RN  Department: [facility]  Number of Participants: 5  Group Focus: acceptance, affirmation, anxiety, clarity of thought, and coping skills Treatment Modality:  Patient-Centered Therapy Interventions utilized were clarification, confrontation, exploration, group exercise, mental fitness, problem solving, and reality testing Purpose: enhance coping skills, explore maladaptive thinking, express feelings, express irrational fears, improve communication skills, increase insight, regain self-worth, reinforce self-care, relapse prevention strategies, and trigger / craving management  Name: Cynthia [name] Date of Birth: Nov 24, 1990  MR: [id number]    Level of Participation: active Quality of Participation: attentive and cooperative Interactions with others: gave feedback Mood/Affect: appropriate Triggers (if applicable):   Cognition: coherent/clear and goal directed Progress: Gaining insight Response:   Plan: follow-up needed  Patients Problems:  Patient Active Problem List   Diagnosis Date Noted   Alcohol abuse 11/28/2023   DM (diabetes mellitus), type 2 (HCC) 09/09/2017   Lactic acidosis 09/09/2017   Toxic encephalopathy 09/09/2017   MDD (major depressive disorder), recurrent severe, without psychosis (HCC)    Overdose 09/07/2017   Altered mental status    S/P total thyroidectomy 09/24/2015

## 2023-12-01 DIAGNOSIS — Z79890 Hormone replacement therapy (postmenopausal): Secondary | ICD-10-CM | POA: Diagnosis not present

## 2023-12-01 DIAGNOSIS — Z7984 Long term (current) use of oral hypoglycemic drugs: Secondary | ICD-10-CM | POA: Diagnosis not present

## 2023-12-01 DIAGNOSIS — F1010 Alcohol abuse, uncomplicated: Secondary | ICD-10-CM | POA: Diagnosis not present

## 2023-12-01 DIAGNOSIS — F339 Major depressive disorder, recurrent, unspecified: Secondary | ICD-10-CM | POA: Diagnosis not present

## 2023-12-01 MED ORDER — TRIPLE ANTIBIOTIC 3.5-400-5000 EX OINT
1.0000 | TOPICAL_OINTMENT | Freq: Once | CUTANEOUS | Status: AC
  Administered 2023-12-01: 1 via TOPICAL
  Filled 2023-12-01: qty 1

## 2023-12-01 NOTE — ED Notes (Signed)
 Pt presents as generally pleasant. Pt denies other pain or discomforts aside from cut on L finger, provider notified, order for neosporin obtained, new bandage applied. Pt denies si hi and avh- verbal contract for safety provided. Pt report to have eaten breakfast. Pt reports mood of 'fine' and 'neutral'.

## 2023-12-01 NOTE — Group Note (Signed)
 Group Topic: Feelings about Diagnosis  Group Date: 12/01/2023 Start Time: 0800 End Time: 0845 Facilitators: Lonzell Dwayne RAMAN, NT  Department: [facility]  Number of Participants: 5  Group Focus: chemical dependency issues Treatment Modality:  Psychoeducation Interventions utilized were patient education Purpose: enhance coping skills  Name: Cynthia [name] Date of Birth: Jul 14, 1990  MR: [id number]    Level of Participation: Patient attended groupactive Quality of Participation: attentive Interactions with others: gave feedback Mood/Affect: positive Triggers (if applicable): N/A Cognition: coherent/clear Progress: Gaining insight Response: Appropriate  Plan: patient will be encouraged to keep up the family support and continue the rehab stay positive   Patients Problems:  Patient Active Problem List   Diagnosis Date Noted   Alcohol abuse 11/28/2023   DM (diabetes mellitus), type 2 (HCC) 09/09/2017   Lactic acidosis 09/09/2017   Toxic encephalopathy 09/09/2017   MDD (major depressive disorder), recurrent severe, without psychosis (HCC)    Overdose 09/07/2017   Altered mental status    S/P total thyroidectomy 09/24/2015

## 2023-12-01 NOTE — ED Notes (Signed)
 Pt generally polite pleasant and cooperative. Pt attended and participated in RN education group. Pt currently taking on phone, gets along with others. Pt compliant with medication, denies withdrawal complaints or otherwise.

## 2023-12-01 NOTE — ED Provider Notes (Signed)
 Behavioral Health Progress Note  Date and Time: 12/01/2023 4:00 PM Name: Cynthia [name] MRN:  [id number]  Subjective:  Cynthia [name] was seen in her room on rounds today. She has been visible and social on the unit. She has been in contact with her dad. She is worried about housing, since she is staying in her dad's home and he has sold it. She is not sure what timeline before she has to be out. She is interested in housing options. No medication side effects. No current withdrawal. No new physical symptoms.   Diagnosis:  Final diagnoses:  Alcohol abuse  Recurrent major depressive disorder, remission status unspecified (HCC)    Total Time spent with patient: 20 minutes  Past Psychiatric History: Alcohol abuse, MDD, GAD Past Medical History: See chart Family History: Unknown Social History: Alcohol, delta 8  Sleep: Good  Appetite:  Good  Current Medications:  Current Facility-Administered Medications  Medication Dose Route Frequency Provider Last Rate Last Admin   acetaminophen  (TYLENOL ) tablet 650 mg  650 mg Oral Q6H PRN Trudy Carwin, NP       alum & mag hydroxide-simeth (MAALOX/MYLANTA) 200-200-20 MG/5ML suspension 30 mL  30 mL Oral Q4H PRN Trudy Carwin, NP       chlordiazePOXIDE  (LIBRIUM ) capsule 5 mg  5 mg Oral TID Leigh Corean Massa, MD       Followed by   NOREEN ON 12/02/2023] chlordiazePOXIDE  (LIBRIUM ) capsule 5 mg  5 mg Oral QHS Kagen Kunath, Corean Massa, MD       haloperidol  (HALDOL ) tablet 5 mg  5 mg Oral TID PRN Trudy Carwin, NP       And   diphenhydrAMINE  (BENADRYL ) capsule 50 mg  50 mg Oral TID PRN Trudy Carwin, NP       feeding supplement (ENSURE PLUS HIGH PROTEIN) liquid 237 mL  237 mL Oral BID BM Himani Corona, Corean Massa, MD   237 mL at 11/30/23 1731   hydrOXYzine  (ATARAX ) tablet 25 mg  25 mg Oral Q6H PRN Trudy Carwin, NP       hydrOXYzine  (ATARAX ) tablet 25 mg  25 mg Oral Q6H PRN Trudy Carwin, NP   25 mg at 11/29/23 2109   levothyroxine  (SYNTHROID ) tablet 200 mcg  200  mcg Oral Q0600 Trudy Carwin, NP   200 mcg at 12/01/23 9371   loperamide  (IMODIUM ) capsule 2-4 mg  2-4 mg Oral PRN Trudy Carwin, NP       LORazepam  (ATIVAN ) tablet 1 mg  1 mg Oral Q6H PRN Trudy Carwin, NP       magnesium  hydroxide (MILK OF MAGNESIA) suspension 30 mL  30 mL Oral Daily PRN Trudy Carwin, NP       metFORMIN  (GLUCOPHAGE -XR) 24 hr tablet 500 mg  500 mg Oral Q breakfast Trudy Carwin, NP   500 mg at 12/01/23 9141   multivitamin with minerals tablet 1 tablet  1 tablet Oral Daily Trudy Carwin, NP   1 tablet at 12/01/23 9074   OLANZapine  (ZYPREXA ) injection 10 mg  10 mg Intramuscular TID PRN Trudy Carwin, NP       OLANZapine  (ZYPREXA ) injection 5 mg  5 mg Intramuscular TID PRN Trudy Carwin, NP       ondansetron  (ZOFRAN -ODT) disintegrating tablet 4 mg  4 mg Oral Q6H PRN Trudy Carwin, NP       pantoprazole  (PROTONIX ) EC tablet 40 mg  40 mg Oral Daily Kemonie Cutillo, Corean Massa, MD   40 mg at 12/01/23 9076   sertraline  (ZOLOFT ) tablet 50 mg  50  mg Oral Daily Trudy Carwin, NP   50 mg at 12/01/23 9076   thiamine  (VITAMIN B1) injection 100 mg  100 mg Intramuscular Once Trudy Carwin, NP       thiamine  (VITAMIN B1) tablet 100 mg  100 mg Oral Daily Trudy Carwin, NP   100 mg at 12/01/23 9076   traZODone  (DESYREL ) tablet 50 mg  50 mg Oral QHS PRN Trudy Carwin, NP   50 mg at 11/30/23 2119   Current Outpatient Medications  Medication Sig Dispense Refill   calcium carbonate (TUMS - DOSED IN MG ELEMENTAL CALCIUM) 500 MG chewable tablet Chew 3 tablets by mouth daily as needed for indigestion or heartburn.     FLUoxetine (PROZAC) 20 MG capsule Take 20 mg by mouth daily. Take with a 40mg  capsule for a total of 60mg      FLUoxetine (PROZAC) 40 MG capsule Take 40 mg by mouth daily. Take with a 20mg  capsule for a total of 60mg      glipiZIDE (GLUCOTROL XL) 10 MG 24 hr tablet Take 10 mg by mouth daily.     hydrOXYzine  (ATARAX /VISTARIL ) 25 MG tablet Take 1 tablet (25 mg) by mouth four times daily as needed:  For anxiety (Patient taking differently: Take 25 mg by mouth 3 (three) times daily.) 60 tablet 0   levothyroxine  (SYNTHROID ) 125 MCG tablet Take 250 mcg by mouth daily before breakfast.     omeprazole (PRILOSEC) 40 MG capsule Take 40 mg by mouth daily.     rosuvastatin (CRESTOR) 10 MG tablet Take 10 mg by mouth daily.      Labs  Lab Results:  Admission on 11/28/2023  Component Date Value Ref Range Status   WBC 11/28/2023 6.0  4.0 - 10.5 K/uL Final   RBC 11/28/2023 4.50  3.87 - 5.11 MIL/uL Final   Hemoglobin 11/28/2023 14.0  12.0 - 15.0 g/dL Final   HCT 90/98/7974 40.5  36.0 - 46.0 % Final   MCV 11/28/2023 90.0  80.0 - 100.0 fL Final   MCH 11/28/2023 31.1  26.0 - 34.0 pg Final   MCHC 11/28/2023 34.6  30.0 - 36.0 g/dL Final   RDW 90/98/7974 12.1  11.5 - 15.5 % Final   Platelets 11/28/2023 121 (L)  150 - 400 K/uL Final   nRBC 11/28/2023 0.0  0.0 - 0.2 % Final   Neutrophils Relative % 11/28/2023 48  % Final   Neutro Abs 11/28/2023 2.9  1.7 - 7.7 K/uL Final   Lymphocytes Relative 11/28/2023 45  % Final   Lymphs Abs 11/28/2023 2.7  0.7 - 4.0 K/uL Final   Monocytes Relative 11/28/2023 5  % Final   Monocytes Absolute 11/28/2023 0.3  0.1 - 1.0 K/uL Final   Eosinophils Relative 11/28/2023 1  % Final   Eosinophils Absolute 11/28/2023 0.1  0.0 - 0.5 K/uL Final   Basophils Relative 11/28/2023 1  % Final   Basophils Absolute 11/28/2023 0.0  0.0 - 0.1 K/uL Final   Immature Granulocytes 11/28/2023 0  % Final   Abs Immature Granulocytes 11/28/2023 0.01  0.00 - 0.07 K/uL Final   Performed at [facility], [address], [place], [place] [address]   Sodium 11/28/2023 139  135 - 145 mmol/L Final   Potassium 11/28/2023 5.1  3.5 - 5.1 mmol/L Final   Chloride 11/28/2023 100  98 - 111 mmol/L Final   CO2 11/28/2023 24  22 - 32 mmol/L Final   Glucose, Bld 11/28/2023 113 (H)  70 - 99 mg/dL Final  Glucose reference range applies only to samples taken after fasting for at least 8 hours.   BUN  11/28/2023 <5 (L)  6 - 20 mg/dL Final   Creatinine, Ser 11/28/2023 0.77  0.44 - 1.00 mg/dL Final   Calcium 90/98/7974 8.9  8.9 - 10.3 mg/dL Final   Total Protein 90/98/7974 7.3  6.5 - 8.1 g/dL Final   Albumin 90/98/7974 3.8  3.5 - 5.0 g/dL Final   AST 90/98/7974 163 (H)  15 - 41 U/L Final   ALT 11/28/2023 93 (H)  0 - 44 U/L Final   Alkaline Phosphatase 11/28/2023 88  38 - 126 U/L Final   Total Bilirubin 11/28/2023 0.7  0.0 - 1.2 mg/dL Final   GFR, Estimated 11/28/2023 >60  >60 mL/min Final   Comment: (NOTE) Calculated using the CKD-EPI Creatinine Equation (2021)    Anion gap 11/28/2023 15  5 - 15 Final   Performed at [facility], [address], [place], [place] [address]   Alcohol, Ethyl (B) 11/28/2023 259 (H)  <15 mg/dL Final   Comment: (NOTE) For medical purposes only. Performed at [facility], [address], [place], [place] [address]    TSH 11/28/2023 0.102 (L)  0.350 - 4.500 uIU/mL Final   Comment: Performed by a 3rd Generation assay with a functional sensitivity of <=0.01 uIU/mL. Performed at [facility], [address], [place], [place] [address]    Preg Test, Ur 11/28/2023 Negative  Negative Final   POC Amphetamine UR 11/28/2023 None Detected  NONE DETECTED (Cut Off Level 1000 ng/mL) Final   POC Secobarbital (BAR) 11/28/2023 None Detected  NONE DETECTED (Cut Off Level 300 ng/mL) Final   POC Buprenorphine (BUP) 11/28/2023 None Detected  NONE DETECTED (Cut Off Level 10 ng/mL) Final   POC Oxazepam (BZO) 11/28/2023 None Detected  NONE DETECTED (Cut Off Level 300 ng/mL) Final   POC Cocaine UR 11/28/2023 None Detected  NONE DETECTED (Cut Off Level 300 ng/mL) Final   POC Methamphetamine UR 11/28/2023 None Detected  NONE DETECTED (Cut Off Level 1000 ng/mL) Final   POC Morphine 11/28/2023 None Detected  NONE DETECTED (Cut Off Level 300 ng/mL) Final   POC Methadone UR 11/28/2023 None Detected  NONE DETECTED (Cut Off Level 300 ng/mL) Final   POC Oxycodone  UR  11/28/2023 None Detected  NONE DETECTED (Cut Off Level 100 ng/mL) Final   POC Marijuana UR 11/28/2023 Positive (A)  NONE DETECTED (Cut Off Level 50 ng/mL) Final    Blood Alcohol level:  Lab Results  Component Value Date   ETH 259 (H) 11/28/2023   ETH 121 (H) 09/07/2017    Metabolic Disorder Labs: Lab Results  Component Value Date   HGBA1C 7.6 (H) 09/07/2017   MPG 171.42 09/07/2017   No results found for: PROLACTIN No results found for: CHOL, TRIG, HDL, CHOLHDL, VLDL, LDLCALC  Therapeutic Lab Levels: No results found for: LITHIUM No results found for: VALPROATE No results found for: CBMZ  Physical Findings   AIMS    Flowsheet Row [facility]  AIMS Total Score 0   AUDIT    Flowsheet Row [facility]  Alcohol Use Disorder Identification Test Final Score (AUDIT) 17   PHQ2-9    Flowsheet Row ED from 11/28/2023 in [facility]  PHQ-2 Total Score 5  PHQ-9 Total Score 14   Flowsheet Row ED from 11/28/2023  in [facility] Most recent reading at 11/28/2023 10:08 PM ED from 11/28/2023 in [facility] Most recent reading at 11/28/2023  6:59 PM ED from 05/19/2021 in [facility] at [facility] Most recent reading at 05/19/2021  8:18 PM  C-SSRS RISK CATEGORY Moderate Risk Moderate Risk No Risk     Musculoskeletal  Strength & Muscle Tone: within normal limits Gait & Station: normal Patient leans: N/A  Psychiatric Specialty Exam  Presentation  General Appearance:  Appropriate for Environment  Eye Contact: Good  Speech: Normal Rate  Speech Volume: Normal  Handedness: Right   Mood and Affect  Mood: Euthymic  Affect: Congruent   Thought Process  Thought Processes: Linear  Descriptions  of Associations:Intact  Orientation:Full (Time, Place and Person)  Thought Content:Logical  Diagnosis of Schizophrenia or Schizoaffective disorder in past: No    Hallucinations:Hallucinations: None  Ideas of Reference:None  Suicidal Thoughts:Suicidal Thoughts: No  Homicidal Thoughts:Homicidal Thoughts: No   Sensorium  Memory: Immediate Good; Remote Fair  Judgment: Good  Insight: Good   Executive Functions  Concentration: Good  Attention Span: Good  Recall: Good  Fund of Knowledge: Good  Language: Good   Psychomotor Activity  Psychomotor Activity: Psychomotor Activity: Normal   Assets  Assets: Communication Skills; Desire for Improvement; Social Support   Sleep  Sleep: Sleep: Good  Estimated Sleeping Duration (Last 24 Hours): 8.50-10.50 hours  No data recorded  Physical Exam  Physical Exam ROS Blood pressure 104/80, pulse 100, temperature 98.2 F (36.8 C), temperature source Oral, resp. rate 17, SpO2 96%. There is no height or weight on file to calculate BMI.  Treatment Plan Summary: Long Term Goals: Improvement in symptoms so as ready for discharge   Short Term Goals: Patient will verbalize feelings in meetings with treatment team members., Patient will attend at least of 50% of the groups daily., Pt will complete the PHQ9 on admission, day 3 and discharge., and Patient will take medications as prescribed daily.   Medications: Mood/anxiety: continue group therapy, milieu therapy, 1:1 evaluation with provider.  Medication management: zoloft  50mg  PO daily for mood and anxiety  Substance Abuse:  brief intervention provided abstinence advised.  Severe alcohol use disorder: replacing thiamine . Encourage adequate PO intake. May require lab monitoring of electrolytes. Benzodiazepine taper as tolerated. Seizure precautions. Monitor for delirium tremens.  Nicotine and tobacco use: N/A Medical: PRNs for pain, constipation, indigestion available.   Labs/studies: no new labs Safety and Monitoring: voluntarily admission to [facility]/Facility based care unit [facility]) unit for safety, stabilization and treatment Daily contact with patient to assess and evaluate symptoms and progress in treatment Patient's case to be discussed in multi-disciplinary team meeting Observation Level : q15 minute checks Vital signs: q12 hours Precautions: withdrawal and seizure   Based on my evaluation the patient does not appear to have an emergency medical condition.   Corean Anette Potters, MD 12/01/2023 4:00 PM

## 2023-12-01 NOTE — Group Note (Signed)
 Group Topic: Wellness  Group Date: 12/01/2023 Start Time: 1700 End Time: 1730 Facilitators: [name], RN  Department: [facility]  Number of Participants: 3  Group Focus: psychiatric education Treatment Modality:  Psychoeducation Interventions utilized were patient education Purpose: increase insight  Name: Cynthia [name] Date of Birth: [birth year]  MR: [id number]    Level of Participation: active Quality of Participation: attentive and cooperative Interactions with others: gave feedback Mood/Affect: appropriate Triggers (if applicable): n/a Cognition: coherent/clear Progress: Gaining insight Response: pt will navigate grief by engaging in partaking in activities like journaling and creative outlets  Plan: patient will be encouraged to attend future RN education groups  Patients Problems:  Patient Active Problem List   Diagnosis Date Noted   Alcohol abuse 11/28/2023   DM (diabetes mellitus), type 2 (HCC) 09/09/2017   Lactic acidosis 09/09/2017   Toxic encephalopathy 09/09/2017   MDD (major depressive disorder), recurrent severe, without psychosis (HCC)    Overdose 09/07/2017   Altered mental status    S/P total thyroidectomy 09/24/2015

## 2023-12-01 NOTE — Group Note (Signed)
 Group Topic: Emotional Regulation  Group Date: 12/01/2023 Start Time: 2030 End Time: 2100 Facilitators: Verdon Jacqualyn BRAVO, NT  Department: [facility]  Number of Participants: 10  Group Focus: depression Treatment Modality:  Individual Therapy Interventions utilized were group exercise Purpose: express feelings  Name: YARELIE HAMS Date of Birth: 11-07-1990  MR: [id number]    Level of Participation: active Quality of Participation: cooperative Interactions with others: gave feedback Mood/Affect: appropriate Triggers (if applicable): n/a Cognition: coherent/clear Progress: Moderate Response: n/a Plan: follow-up needed  Patients Problems:  Patient Active Problem List   Diagnosis Date Noted   Alcohol abuse 11/28/2023   DM (diabetes mellitus), type 2 (HCC) 09/09/2017   Lactic acidosis 09/09/2017   Toxic encephalopathy 09/09/2017   MDD (major depressive disorder), recurrent severe, without psychosis (HCC)    Overdose 09/07/2017   Altered mental status    S/P total thyroidectomy 09/24/2015

## 2023-12-01 NOTE — ED Notes (Signed)
 Pt is sleeping, no acute distress noted.

## 2023-12-02 DIAGNOSIS — F1010 Alcohol abuse, uncomplicated: Secondary | ICD-10-CM | POA: Diagnosis not present

## 2023-12-02 DIAGNOSIS — Z7984 Long term (current) use of oral hypoglycemic drugs: Secondary | ICD-10-CM | POA: Diagnosis not present

## 2023-12-02 DIAGNOSIS — Z79890 Hormone replacement therapy (postmenopausal): Secondary | ICD-10-CM | POA: Diagnosis not present

## 2023-12-02 DIAGNOSIS — F339 Major depressive disorder, recurrent, unspecified: Secondary | ICD-10-CM | POA: Diagnosis not present

## 2023-12-02 NOTE — Group Note (Signed)
 Group Topic: Recovery Basics  Group Date: 12/02/2023 Start Time: 1230 End Time: 1300 Facilitators: Carletha Iha, RN  Department: [facility]  Number of Participants: 9  Group Focus: coping skills Treatment Modality:  Psychoeducation Interventions utilized were patient education Purpose: enhance coping skills  Name: Cynthia [name] Date of Birth: April 21, 1990  MR: [id number]    Level of Participation: moderate Quality of Participation: attentive Interactions with others: gave feedback Mood/Affect: anxious Triggers (if applicable):  Cognition: insightful Progress: Moderate Response:  Plan: patient will be encouraged to continue to practice coping skills after discharge  Patients Problems:  Patient Active Problem List   Diagnosis Date Noted   Alcohol abuse 11/28/2023   DM (diabetes mellitus), type 2 (HCC) 09/09/2017   Lactic acidosis 09/09/2017   Toxic encephalopathy 09/09/2017   MDD (major depressive disorder), recurrent severe, without psychosis (HCC)    Overdose 09/07/2017   Altered mental status    S/P total thyroidectomy 09/24/2015

## 2023-12-02 NOTE — Discharge Planning (Signed)
 SW received call back from [facility] AT [facility] and patient was approved and can admit on Monday 9/8@ 9:00 with cab to be arranged for 8:15 pick up. She will be provided with 30 day scripts and 14 day supply of medications.

## 2023-12-02 NOTE — ED Notes (Signed)
 Pt sleeping at the moment. No acute distress noted.

## 2023-12-02 NOTE — ED Provider Notes (Signed)
 Behavioral Health Progress Note  Date and Time: 12/02/2023 2:20 PM Name: Cynthia [name] MRN:  [id number]  Subjective:  Cynthia [name] was seen today in the common area. She feels that she is doing good overall. She is sleeping and eating okay. No medication side effects. She has been making calls to programs provided by SW. She still does not have permanent housing solutions but has some temporary ones that she is considering.   Diagnosis:  Final diagnoses:  Alcohol abuse  Recurrent major depressive disorder, remission status unspecified (HCC)    Total Time spent with patient: 20 minutes  Past Psychiatric History: Alcohol abuse, MDD, GAD Past Medical History: See chart Family History: Unknown Social History: Alcohol, delta 8  Sleep: Good  Appetite:  Good  Current Medications:  Current Facility-Administered Medications  Medication Dose Route Frequency Provider Last Rate Last Admin   acetaminophen  (TYLENOL ) tablet 650 mg  650 mg Oral Q6H PRN Trudy Carwin, NP       alum & mag hydroxide-simeth (MAALOX/MYLANTA) 200-200-20 MG/5ML suspension 30 mL  30 mL Oral Q4H PRN Trudy Carwin, NP       chlordiazePOXIDE  (LIBRIUM ) capsule 5 mg  5 mg Oral TID Leigh Corean Massa, MD   5 mg at 12/02/23 0845   Followed by   chlordiazePOXIDE  (LIBRIUM ) capsule 5 mg  5 mg Oral QHS Taleia Sadowski, Corean Massa, MD       haloperidol  (HALDOL ) tablet 5 mg  5 mg Oral TID PRN Trudy Carwin, NP       And   diphenhydrAMINE  (BENADRYL ) capsule 50 mg  50 mg Oral TID PRN Trudy Carwin, NP       feeding supplement (ENSURE PLUS HIGH PROTEIN) liquid 237 mL  237 mL Oral BID BM Oluchi Pucci, Corean Massa, MD   237 mL at 12/02/23 0921   hydrOXYzine  (ATARAX ) tablet 25 mg  25 mg Oral Q6H PRN Trudy Carwin, NP       levothyroxine  (SYNTHROID ) tablet 200 mcg  200 mcg Oral Q0600 Trudy Carwin, NP   200 mcg at 12/02/23 0631   magnesium  hydroxide (MILK OF MAGNESIA) suspension 30 mL  30 mL Oral Daily PRN Trudy Carwin, NP       metFORMIN   (GLUCOPHAGE -XR) 24 hr tablet 500 mg  500 mg Oral Q breakfast Trudy Carwin, NP   500 mg at 12/02/23 0845   multivitamin with minerals tablet 1 tablet  1 tablet Oral Daily Trudy Carwin, NP   1 tablet at 12/02/23 0845   OLANZapine  (ZYPREXA ) injection 10 mg  10 mg Intramuscular TID PRN Trudy Carwin, NP       OLANZapine  (ZYPREXA ) injection 5 mg  5 mg Intramuscular TID PRN Trudy Carwin, NP       pantoprazole  (PROTONIX ) EC tablet 40 mg  40 mg Oral Daily Rip Hawes, Corean Massa, MD   40 mg at 12/02/23 0845   sertraline  (ZOLOFT ) tablet 50 mg  50 mg Oral Daily Trudy Carwin, NP   50 mg at 12/02/23 0920   thiamine  (VITAMIN B1) injection 100 mg  100 mg Intramuscular Once Trudy Carwin, NP       thiamine  (VITAMIN B1) tablet 100 mg  100 mg Oral Daily Trudy Carwin, NP   100 mg at 12/02/23 0845   traZODone  (DESYREL ) tablet 50 mg  50 mg Oral QHS PRN Trudy Carwin, NP   50 mg at 12/01/23 2103   Current Outpatient Medications  Medication Sig Dispense Refill   calcium carbonate (TUMS - DOSED IN MG ELEMENTAL CALCIUM) 500 MG chewable  tablet Chew 3 tablets by mouth daily as needed for indigestion or heartburn.     FLUoxetine (PROZAC) 20 MG capsule Take 20 mg by mouth daily. Take with a 40mg  capsule for a total of 60mg      FLUoxetine (PROZAC) 40 MG capsule Take 40 mg by mouth daily. Take with a 20mg  capsule for a total of 60mg      glipiZIDE (GLUCOTROL XL) 10 MG 24 hr tablet Take 10 mg by mouth daily.     hydrOXYzine  (ATARAX /VISTARIL ) 25 MG tablet Take 1 tablet (25 mg) by mouth four times daily as needed: For anxiety (Patient taking differently: Take 25 mg by mouth 3 (three) times daily.) 60 tablet 0   levothyroxine  (SYNTHROID ) 125 MCG tablet Take 250 mcg by mouth daily before breakfast.     omeprazole (PRILOSEC) 40 MG capsule Take 40 mg by mouth daily.     rosuvastatin (CRESTOR) 10 MG tablet Take 10 mg by mouth daily.      Labs  Lab Results:  Admission on 11/28/2023  Component Date Value Ref Range Status   WBC  11/28/2023 6.0  4.0 - 10.5 K/uL Final   RBC 11/28/2023 4.50  3.87 - 5.11 MIL/uL Final   Hemoglobin 11/28/2023 14.0  12.0 - 15.0 g/dL Final   HCT 90/98/7974 40.5  36.0 - 46.0 % Final   MCV 11/28/2023 90.0  80.0 - 100.0 fL Final   MCH 11/28/2023 31.1  26.0 - 34.0 pg Final   MCHC 11/28/2023 34.6  30.0 - 36.0 g/dL Final   RDW 90/98/7974 12.1  11.5 - 15.5 % Final   Platelets 11/28/2023 121 (L)  150 - 400 K/uL Final   nRBC 11/28/2023 0.0  0.0 - 0.2 % Final   Neutrophils Relative % 11/28/2023 48  % Final   Neutro Abs 11/28/2023 2.9  1.7 - 7.7 K/uL Final   Lymphocytes Relative 11/28/2023 45  % Final   Lymphs Abs 11/28/2023 2.7  0.7 - 4.0 K/uL Final   Monocytes Relative 11/28/2023 5  % Final   Monocytes Absolute 11/28/2023 0.3  0.1 - 1.0 K/uL Final   Eosinophils Relative 11/28/2023 1  % Final   Eosinophils Absolute 11/28/2023 0.1  0.0 - 0.5 K/uL Final   Basophils Relative 11/28/2023 1  % Final   Basophils Absolute 11/28/2023 0.0  0.0 - 0.1 K/uL Final   Immature Granulocytes 11/28/2023 0  % Final   Abs Immature Granulocytes 11/28/2023 0.01  0.00 - 0.07 K/uL Final   Performed at [facility], [address] [place], [place] [address]   Sodium 11/28/2023 139  135 - 145 mmol/L Final   Potassium 11/28/2023 5.1  3.5 - 5.1 mmol/L Final   Chloride 11/28/2023 100  98 - 111 mmol/L Final   CO2 11/28/2023 24  22 - 32 mmol/L Final   Glucose, Bld 11/28/2023 113 (H)  70 - 99 mg/dL Final   Glucose reference range applies only to samples taken after fasting for at least 8 hours.   BUN 11/28/2023 <5 (L)  6 - 20 mg/dL Final   Creatinine, Ser 11/28/2023 0.77  0.44 - 1.00 mg/dL Final   Calcium 90/98/7974 8.9  8.9 - 10.3 mg/dL Final   Total Protein 90/98/7974 7.3  6.5 - 8.1 g/dL Final   Albumin 90/98/7974 3.8  3.5 - 5.0 g/dL Final   AST 90/98/7974 163 (H)  15 - 41 U/L Final   ALT 11/28/2023 93 (H)  0 - 44 U/L Final   Alkaline Phosphatase 11/28/2023  88  38 - 126 U/L Final   Total Bilirubin 11/28/2023 0.7   0.0 - 1.2 mg/dL Final   GFR, Estimated 11/28/2023 >60  >60 mL/min Final   Comment: (NOTE) Calculated using the CKD-EPI Creatinine Equation (2021)    Anion gap 11/28/2023 15  5 - 15 Final   Performed at [facility], [address], [place], [place] [address]   Alcohol, Ethyl (B) 11/28/2023 259 (H)  <15 mg/dL Final   Comment: (NOTE) For medical purposes only. Performed at [facility], [address] [place], [place] [address]    TSH 11/28/2023 0.102 (L)  0.350 - 4.500 uIU/mL Final   Comment: Performed by a 3rd Generation assay with a functional sensitivity of <=0.01 uIU/mL. Performed at [facility], [address] [place], [place] [address]    Preg Test, Ur 11/28/2023 Negative  Negative Final   POC Amphetamine UR 11/28/2023 None Detected  NONE DETECTED (Cut Off Level 1000 ng/mL) Final   POC Secobarbital (BAR) 11/28/2023 None Detected  NONE DETECTED (Cut Off Level 300 ng/mL) Final   POC Buprenorphine (BUP) 11/28/2023 None Detected  NONE DETECTED (Cut Off Level 10 ng/mL) Final   POC Oxazepam (BZO) 11/28/2023 None Detected  NONE DETECTED (Cut Off Level 300 ng/mL) Final   POC Cocaine UR 11/28/2023 None Detected  NONE DETECTED (Cut Off Level 300 ng/mL) Final   POC Methamphetamine UR 11/28/2023 None Detected  NONE DETECTED (Cut Off Level 1000 ng/mL) Final   POC Morphine 11/28/2023 None Detected  NONE DETECTED (Cut Off Level 300 ng/mL) Final   POC Methadone UR 11/28/2023 None Detected  NONE DETECTED (Cut Off Level 300 ng/mL) Final   POC Oxycodone  UR 11/28/2023 None Detected  NONE DETECTED (Cut Off Level 100 ng/mL) Final   POC Marijuana UR 11/28/2023 Positive (A)  NONE DETECTED (Cut Off Level 50 ng/mL) Final    Blood Alcohol level:  Lab Results  Component Value Date   ETH 259 (H) 11/28/2023   ETH 121 (H) 09/07/2017    Metabolic Disorder Labs: Lab Results  Component Value Date   HGBA1C 7.6 (H) 09/07/2017   MPG 171.42 09/07/2017   No results found for:  PROLACTIN No results found for: CHOL, TRIG, HDL, CHOLHDL, VLDL, LDLCALC  Therapeutic Lab Levels: No results found for: LITHIUM No results found for: VALPROATE No results found for: CBMZ  Physical Findings   AIMS    Flowsheet Row [facility]  AIMS Total Score 0   AUDIT    Flowsheet Row [facility]  Alcohol Use Disorder Identification Test Final Score (AUDIT) 17   PHQ2-9    Flowsheet Row ED from 11/28/2023 in [facility]  PHQ-2 Total Score 5  PHQ-9 Total Score 14   Flowsheet Row ED from 11/28/2023 in [facility] Most recent reading at 11/28/2023 10:08 PM ED from 11/28/2023 in [facility] Most recent reading at 11/28/2023  6:59 PM ED from 05/19/2021 in [facility] Most recent reading at 05/19/2021  8:18 PM  C-SSRS RISK CATEGORY Moderate Risk Moderate Risk No Risk     Musculoskeletal  Strength & Muscle Tone: within normal limits Gait & Station: normal Patient leans: N/A  Psychiatric Specialty Exam  Presentation  General Appearance:  Appropriate for Environment  Eye Contact: Good  Speech: Normal Rate  Speech Volume: Normal  Handedness: Right   Mood and Affect  Mood: Euthymic  Affect: Congruent   Thought Process  Thought Processes: Linear  Descriptions of Associations:Intact  Orientation:Full (Time, Place and Person)  Thought Content:Logical  Diagnosis of Schizophrenia or Schizoaffective disorder in past: No    Hallucinations:Hallucinations: None  Ideas of Reference:None  Suicidal Thoughts:Suicidal Thoughts: No  Homicidal Thoughts:Homicidal Thoughts: No   Sensorium  Memory: Immediate Good; Remote Fair  Judgment: Good  Insight: Good   Executive  Functions  Concentration: Good  Attention Span: Good  Recall: Good  Fund of Knowledge: Good  Language: Good   Psychomotor Activity  Psychomotor Activity: Psychomotor Activity: Normal   Assets  Assets: Communication Skills; Desire for Improvement; Social Support   Sleep  Sleep: Sleep: Good  Estimated Sleeping Duration (Last 24 Hours): 9.50-12.00 hours  No data recorded  Physical Exam  Physical Exam Vitals and nursing note reviewed.  Constitutional:      Appearance: Normal appearance.  HENT:     Head: Normocephalic.     Nose:     Comments: bruising Eyes:     Extraocular Movements: Extraocular movements intact.  Pulmonary:     Effort: Pulmonary effort is normal.  Musculoskeletal:        General: Normal range of motion.     Cervical back: Normal range of motion.  Neurological:     Mental Status: She is alert and oriented to person, place, and time.  Psychiatric:        Behavior: Behavior normal.    Review of Systems  Constitutional:  Negative for chills, diaphoresis and fever.  Gastrointestinal:  Negative for diarrhea, nausea and vomiting.  Genitourinary:  Negative for dysuria.  Musculoskeletal:  Negative for myalgias.  Neurological:  Negative for tremors.  Psychiatric/Behavioral:  Negative for hallucinations and suicidal ideas.    Blood pressure 106/79, pulse 100, temperature 97.6 F (36.4 C), temperature source Oral, resp. rate 18, SpO2 100%. There is no height or weight on file to calculate BMI.  Treatment Plan Summary: Long Term Goals: Improvement in symptoms so as ready for discharge   Short Term Goals: Patient will verbalize feelings in meetings with treatment team members., Patient will attend at least of 50% of the groups daily., Pt will complete the PHQ9 on admission, day 3 and discharge., and Patient will take medications as prescribed daily.   Medications: Mood/anxiety: continue group therapy, milieu therapy, 1:1 evaluation with  provider.  Medication management: zoloft  50mg  PO daily for mood and anxiety  Substance Abuse:  brief intervention provided abstinence advised.  Severe alcohol use disorder: replacing thiamine . Encourage adequate PO intake. May require lab monitoring of electrolytes. Benzodiazepine taper as tolerated. Seizure precautions. Monitor for delirium tremens.  Nicotine and tobacco use: N/A Medical: PRNs for pain, constipation, indigestion available.  Labs/studies: no new labs Safety and Monitoring: voluntarily admission to [facility]/Facility based care unit [facility]) unit for safety, stabilization and treatment Daily contact with patient to assess and evaluate symptoms and progress in treatment Patient's case to be discussed in multi-disciplinary team meeting Observation Level : q15 minute checks Vital signs: q12 hours Precautions: withdrawal and seizure   Based on my evaluation the patient does not appear to have an emergency medical condition.   Corean Anette Potters, MD 12/02/2023 2:20 PM

## 2023-12-02 NOTE — [facility] Group Notes (Signed)
 SPIRITUALITY GROUP NOTE   Spirituality group facilitated by Chaplain Zyiah Withington, MDiv, [facility].   Group Description:  Group focused on topic of hope.  Patients participated in facilitated discussion around topic, connecting with one another around experiences and definitions for hope.  Group members engaged with visual explorer photos, reflecting on what hope looks like for them today.  Group engaged in discussion around how their definitions of hope are present today in hospital.    Modalities: Psycho-social ed, Adlerian, Narrative, MI  Patient Progress: Cynthia [name] was present throughout group.  Actively engaged in conversation with facilitator and peers.  Noted ways that she makes hope real and connected hope to action.

## 2023-12-02 NOTE — Group Note (Signed)
 Group Topic: Change and Accountability  Group Date: 12/02/2023 Start Time: 1930 End Time: 2000 Facilitators: Anice Benton LABOR, NT  Department: [facility]  Number of Participants: 5  Group Focus: acceptance and feeling awareness/expression Treatment Modality:  Individual Therapy Interventions utilized were assignment and reality testing Purpose: explore maladaptive thinking and express feelings  Name: MATALIE ROMBERGER Date of Birth: Feb 15, 1991  MR: [id number]    Level of Participation: minimal Quality of Participation: distractible Interactions with others: N/A Mood/Affect: appropriate Triggers (if applicable): N/A Cognition: not focused Progress: Minimal Response: N/A Plan: follow-up needed  Patients Problems:  Patient Active Problem List   Diagnosis Date Noted   Alcohol abuse 11/28/2023   DM (diabetes mellitus), type 2 (HCC) 09/09/2017   Lactic acidosis 09/09/2017   Toxic encephalopathy 09/09/2017   MDD (major depressive disorder), recurrent severe, without psychosis (HCC)    Overdose 09/07/2017   Altered mental status    S/P total thyroidectomy 09/24/2015

## 2023-12-02 NOTE — ED Notes (Signed)
 Pt sleeping currently, no acute distress noted. Routine facility checks in place.

## 2023-12-02 NOTE — ED Notes (Signed)
 Pt is calm and cooperative.  Anxious affect.  Librium  taper continues.  Slight tremors noted.  Denied other ETOH withdrawal symptoms at present Q 15 minute observations for safety continue

## 2023-12-02 NOTE — ED Notes (Signed)
 Pt is sleeping at the moment. No acute distress noted.

## 2023-12-02 NOTE — Discharge Planning (Signed)
 SW spoke with patient who stated she had been calling a few shelters and looking into returning to [facility] transitional housing and now also wanted to be referred to [facility]. SW sent her referral to [facility] and will follow up with Rosaline soon to check approval and or bed availability. Will continue to follow.

## 2023-12-02 NOTE — ED Notes (Signed)
 Patient attending chaplain group.

## 2023-12-02 NOTE — ED Notes (Signed)
 Pt observed lying in bed. Eyes closed respirations even and non labored. NAD q 15 minute observations continue for safety.

## 2023-12-03 DIAGNOSIS — F1010 Alcohol abuse, uncomplicated: Secondary | ICD-10-CM | POA: Diagnosis not present

## 2023-12-03 DIAGNOSIS — Z79890 Hormone replacement therapy (postmenopausal): Secondary | ICD-10-CM | POA: Diagnosis not present

## 2023-12-03 DIAGNOSIS — Z7984 Long term (current) use of oral hypoglycemic drugs: Secondary | ICD-10-CM | POA: Diagnosis not present

## 2023-12-03 DIAGNOSIS — F339 Major depressive disorder, recurrent, unspecified: Secondary | ICD-10-CM | POA: Diagnosis not present

## 2023-12-03 NOTE — ED Provider Notes (Signed)
 Behavioral Health Progress Note  Date and Time: 12/03/2023 3:07 PM Name: Cynthia [name] MRN:  [id number]  Subjective:  Cynthia [name] was seen this afternoon in the common area. She is in good spirits. She has been visible and social on the unit. She has been active in discharge planning. Sleep and appetite are okay, but she did have some vomiting yesterday with juice. We discuss alcohol and the increased chance of pancreatitis and reflux with alcohol use.   Diagnosis:  Final diagnoses:  Alcohol abuse  Recurrent major depressive disorder, remission status unspecified (HCC)    Total Time spent with patient: 20 minutes  Past Psychiatric History: Alcohol abuse, MDD, GAD Past Medical History: See chart Family History: Unknown Social History: Alcohol, delta 8  Sleep: Good  Appetite:  Fair  Current Medications:  Current Facility-Administered Medications  Medication Dose Route Frequency Provider Last Rate Last Admin   acetaminophen  (TYLENOL ) tablet 650 mg  650 mg Oral Q6H PRN Trudy Carwin, NP       alum & mag hydroxide-simeth (MAALOX/MYLANTA) 200-200-20 MG/5ML suspension 30 mL  30 mL Oral Q4H PRN Trudy Carwin, NP       haloperidol  (HALDOL ) tablet 5 mg  5 mg Oral TID PRN Trudy Carwin, NP       And   diphenhydrAMINE  (BENADRYL ) capsule 50 mg  50 mg Oral TID PRN Trudy Carwin, NP       feeding supplement (ENSURE PLUS HIGH PROTEIN) liquid 237 mL  237 mL Oral BID BM Wing Schoch, Corean Massa, MD   237 mL at 12/02/23 0921   hydrOXYzine  (ATARAX ) tablet 25 mg  25 mg Oral Q6H PRN Trudy Carwin, NP       levothyroxine  (SYNTHROID ) tablet 200 mcg  200 mcg Oral Q0600 Trudy Carwin, NP   200 mcg at 12/03/23 9358   magnesium  hydroxide (MILK OF MAGNESIA) suspension 30 mL  30 mL Oral Daily PRN Trudy Carwin, NP       metFORMIN  (GLUCOPHAGE -XR) 24 hr tablet 500 mg  500 mg Oral Q breakfast Trudy Carwin, NP   500 mg at 12/03/23 0900   multivitamin with minerals tablet 1 tablet  1 tablet Oral Daily Trudy Carwin, NP    1 tablet at 12/03/23 9081   OLANZapine  (ZYPREXA ) injection 10 mg  10 mg Intramuscular TID PRN Trudy Carwin, NP       OLANZapine  (ZYPREXA ) injection 5 mg  5 mg Intramuscular TID PRN Trudy Carwin, NP       pantoprazole  (PROTONIX ) EC tablet 40 mg  40 mg Oral Daily Kass Herberger, Corean Massa, MD   40 mg at 12/03/23 9081   sertraline  (ZOLOFT ) tablet 50 mg  50 mg Oral Daily Trudy Carwin, NP   50 mg at 12/03/23 9080   thiamine  (VITAMIN B1) injection 100 mg  100 mg Intramuscular Once Trudy Carwin, NP       thiamine  (VITAMIN B1) tablet 100 mg  100 mg Oral Daily Trudy Carwin, NP   100 mg at 12/03/23 9081   traZODone  (DESYREL ) tablet 50 mg  50 mg Oral QHS PRN Trudy Carwin, NP   50 mg at 12/02/23 2135   Current Outpatient Medications  Medication Sig Dispense Refill   calcium carbonate (TUMS - DOSED IN MG ELEMENTAL CALCIUM) 500 MG chewable tablet Chew 3 tablets by mouth daily as needed for indigestion or heartburn.     FLUoxetine (PROZAC) 20 MG capsule Take 20 mg by mouth daily. Take with a 40mg  capsule for a total of 60mg   FLUoxetine (PROZAC) 40 MG capsule Take 40 mg by mouth daily. Take with a 20mg  capsule for a total of 60mg      glipiZIDE (GLUCOTROL XL) 10 MG 24 hr tablet Take 10 mg by mouth daily.     hydrOXYzine  (ATARAX /VISTARIL ) 25 MG tablet Take 1 tablet (25 mg) by mouth four times daily as needed: For anxiety (Patient taking differently: Take 25 mg by mouth 3 (three) times daily.) 60 tablet 0   levothyroxine  (SYNTHROID ) 125 MCG tablet Take 250 mcg by mouth daily before breakfast.     omeprazole (PRILOSEC) 40 MG capsule Take 40 mg by mouth daily.     rosuvastatin (CRESTOR) 10 MG tablet Take 10 mg by mouth daily.      Labs  Lab Results:  Admission on 11/28/2023  Component Date Value Ref Range Status   WBC 11/28/2023 6.0  4.0 - 10.5 K/uL Final   RBC 11/28/2023 4.50  3.87 - 5.11 MIL/uL Final   Hemoglobin 11/28/2023 14.0  12.0 - 15.0 g/dL Final   HCT 90/98/7974 40.5  36.0 - 46.0 % Final   MCV  11/28/2023 90.0  80.0 - 100.0 fL Final   MCH 11/28/2023 31.1  26.0 - 34.0 pg Final   MCHC 11/28/2023 34.6  30.0 - 36.0 g/dL Final   RDW 90/98/7974 12.1  11.5 - 15.5 % Final   Platelets 11/28/2023 121 (L)  150 - 400 K/uL Final   nRBC 11/28/2023 0.0  0.0 - 0.2 % Final   Neutrophils Relative % 11/28/2023 48  % Final   Neutro Abs 11/28/2023 2.9  1.7 - 7.7 K/uL Final   Lymphocytes Relative 11/28/2023 45  % Final   Lymphs Abs 11/28/2023 2.7  0.7 - 4.0 K/uL Final   Monocytes Relative 11/28/2023 5  % Final   Monocytes Absolute 11/28/2023 0.3  0.1 - 1.0 K/uL Final   Eosinophils Relative 11/28/2023 1  % Final   Eosinophils Absolute 11/28/2023 0.1  0.0 - 0.5 K/uL Final   Basophils Relative 11/28/2023 1  % Final   Basophils Absolute 11/28/2023 0.0  0.0 - 0.1 K/uL Final   Immature Granulocytes 11/28/2023 0  % Final   Abs Immature Granulocytes 11/28/2023 0.01  0.00 - 0.07 K/uL Final   Performed at [facility], [address] [place], [place] [address]   Sodium 11/28/2023 139  135 - 145 mmol/L Final   Potassium 11/28/2023 5.1  3.5 - 5.1 mmol/L Final   Chloride 11/28/2023 100  98 - 111 mmol/L Final   CO2 11/28/2023 24  22 - 32 mmol/L Final   Glucose, Bld 11/28/2023 113 (H)  70 - 99 mg/dL Final   Glucose reference range applies only to samples taken after fasting for at least 8 hours.   BUN 11/28/2023 <5 (L)  6 - 20 mg/dL Final   Creatinine, Ser 11/28/2023 0.77  0.44 - 1.00 mg/dL Final   Calcium 90/98/7974 8.9  8.9 - 10.3 mg/dL Final   Total Protein 90/98/7974 7.3  6.5 - 8.1 g/dL Final   Albumin 90/98/7974 3.8  3.5 - 5.0 g/dL Final   AST 90/98/7974 163 (H)  15 - 41 U/L Final   ALT 11/28/2023 93 (H)  0 - 44 U/L Final   Alkaline Phosphatase 11/28/2023 88  38 - 126 U/L Final   Total Bilirubin 11/28/2023 0.7  0.0 - 1.2 mg/dL Final   GFR, Estimated 11/28/2023 >60  >60 mL/min Final   Comment: (NOTE) Calculated using the CKD-EPI Creatinine Equation (2021)  Anion gap 11/28/2023 15  5 - 15  Final   Performed at [facility], [address] [place], [place] [address]   Alcohol, Ethyl (B) 11/28/2023 259 (H)  <15 mg/dL Final   Comment: (NOTE) For medical purposes only. Performed at [facility], [address], [place], [place] [address]    TSH 11/28/2023 0.102 (L)  0.350 - 4.500 uIU/mL Final   Comment: Performed by a 3rd Generation assay with a functional sensitivity of <=0.01 uIU/mL. Performed at [facility], [address], [place], [place] [address]    Preg Test, Ur 11/28/2023 Negative  Negative Final   POC Amphetamine UR 11/28/2023 None Detected  NONE DETECTED (Cut Off Level 1000 ng/mL) Final   POC Secobarbital (BAR) 11/28/2023 None Detected  NONE DETECTED (Cut Off Level 300 ng/mL) Final   POC Buprenorphine (BUP) 11/28/2023 None Detected  NONE DETECTED (Cut Off Level 10 ng/mL) Final   POC Oxazepam (BZO) 11/28/2023 None Detected  NONE DETECTED (Cut Off Level 300 ng/mL) Final   POC Cocaine UR 11/28/2023 None Detected  NONE DETECTED (Cut Off Level 300 ng/mL) Final   POC Methamphetamine UR 11/28/2023 None Detected  NONE DETECTED (Cut Off Level 1000 ng/mL) Final   POC Morphine 11/28/2023 None Detected  NONE DETECTED (Cut Off Level 300 ng/mL) Final   POC Methadone UR 11/28/2023 None Detected  NONE DETECTED (Cut Off Level 300 ng/mL) Final   POC Oxycodone  UR 11/28/2023 None Detected  NONE DETECTED (Cut Off Level 100 ng/mL) Final   POC Marijuana UR 11/28/2023 Positive (A)  NONE DETECTED (Cut Off Level 50 ng/mL) Final    Blood Alcohol level:  Lab Results  Component Value Date   ETH 259 (H) 11/28/2023   ETH 121 (H) 09/07/2017    Metabolic Disorder Labs: Lab Results  Component Value Date   HGBA1C 7.6 (H) 09/07/2017   MPG 171.42 09/07/2017   No results found for: PROLACTIN No results found for: CHOL, TRIG, HDL, CHOLHDL, VLDL, LDLCALC  Therapeutic Lab Levels: No results found for: LITHIUM No results found for: VALPROATE No results  found for: CBMZ  Physical Findings   AIMS    Flowsheet Row [facility]  AIMS Total Score 0   AUDIT    Flowsheet Row [facility]  Alcohol Use Disorder Identification Test Final Score (AUDIT) 17   PHQ2-9    Flowsheet Row ED from 11/28/2023 in [facility]  PHQ-2 Total Score 5  PHQ-9 Total Score 14   Flowsheet Row ED from 11/28/2023 in [facility] Most recent reading at 11/28/2023 10:08 PM ED from 11/28/2023 in [facility] Most recent reading at 11/28/2023  6:59 PM ED from 05/19/2021 in [facility] Emergency Department at [facility] Most recent reading at 05/19/2021  8:18 PM  C-SSRS RISK CATEGORY Moderate Risk Moderate Risk No Risk     Musculoskeletal  Strength & Muscle Tone: within normal limits Gait & Station: normal Patient leans: N/A  Psychiatric Specialty Exam  Presentation  General Appearance:  Appropriate for Environment  Eye Contact: Good  Speech: Normal Rate  Speech Volume: Normal  Handedness: Right   Mood and Affect  Mood: Euthymic  Affect: Congruent   Thought Process  Thought Processes: Linear  Descriptions of Associations:Intact  Orientation:Full (Time, Place and Person)  Thought Content:Logical  Diagnosis of Schizophrenia or  Schizoaffective disorder in past: No    Hallucinations:No data recorded Ideas of Reference:None  Suicidal Thoughts:No data recorded Homicidal Thoughts:No data recorded  Sensorium  Memory: Immediate Good; Remote Fair  Judgment: Good  Insight: Good   Executive Functions  Concentration: Good  Attention Span: Good  Recall: Good  Fund of Knowledge: Good  Language: Good   Psychomotor Activity  Psychomotor Activity:No data recorded  Assets   Assets: Communication Skills; Desire for Improvement; Social Support   Sleep  Sleep:No data recorded Estimated Sleeping Duration (Last 24 Hours): 9.00-9.25 hours  No data recorded  Physical Exam  Physical Exam Vitals and nursing note reviewed.  Constitutional:      Appearance: Normal appearance.  HENT:     Head: Normocephalic.  Eyes:     Extraocular Movements: Extraocular movements intact.  Pulmonary:     Effort: Pulmonary effort is normal.  Musculoskeletal:        General: Normal range of motion.     Cervical back: Normal range of motion.  Neurological:     General: No focal deficit present.     Mental Status: She is alert and oriented to person, place, and time.  Psychiatric:        Behavior: Behavior normal.    Review of Systems  Constitutional:  Negative for chills and fever.  Gastrointestinal:  Positive for heartburn. Negative for constipation, diarrhea, nausea and vomiting.  Musculoskeletal:  Negative for joint pain and myalgias.  Neurological:  Negative for tremors.  Psychiatric/Behavioral:  Negative for hallucinations and suicidal ideas.    Blood pressure 106/83, pulse 92, temperature 98.4 F (36.9 C), resp. rate 18, SpO2 100%. There is no height or weight on file to calculate BMI.  Treatment Plan Summary: Long Term Goals: Improvement in symptoms so as ready for discharge   Short Term Goals: Patient will verbalize feelings in meetings with treatment team members., Patient will attend at least of 50% of the groups daily., Pt will complete the PHQ9 on admission, day 3 and discharge., and Patient will take medications as prescribed daily.   Medications: Mood/anxiety: continue group therapy, milieu therapy, 1:1 evaluation with provider.  Medication management: zoloft  50mg  PO daily for mood and anxiety  Substance Abuse:  brief intervention provided abstinence advised.  Severe alcohol use disorder: replacing thiamine . Encourage adequate PO intake. May require  lab monitoring of electrolytes. Benzodiazepine taper as tolerated. Seizure precautions. Monitor for delirium tremens.  Nicotine and tobacco use: N/A Medical: PRNs for pain, constipation, indigestion available.  Labs/studies: no new labs Safety and Monitoring: voluntarily admission to [facility]/Facility based care unit [facility]) unit for safety, stabilization and treatment Daily contact with patient to assess and evaluate symptoms and progress in treatment Patient's case to be discussed in multi-disciplinary team meeting Observation Level : q15 minute checks Vital signs: q12 hours Precautions: withdrawal and seizure   Based on my evaluation the patient does not appear to have an emergency medical condition  Corean Anette Potters, MD 12/03/2023 3:07 PM

## 2023-12-03 NOTE — ED Notes (Signed)
Pt is sleeping at this moment.

## 2023-12-03 NOTE — ED Notes (Signed)
 Patient is in the bedroom calm and sleeping. NAD Will monitor for safety.

## 2023-12-03 NOTE — ED Notes (Signed)
 Pt sitting in dayroom watching television and interacting with peers. No acute distress noted. No concerns voiced. Informed pt to notify staff with any needs or assistance. Pt verbalized understanding and agreement. Will continue to monitor for safety.

## 2023-12-03 NOTE — ED Notes (Signed)
 Patient A&Ox4. Denies intent to harm self/others when asked. Denies A/VH. Patient denies any physical complaints when asked. No acute distress noted. Support and encouragement provided. Routine safety checks conducted according to facility protocol. Encouraged patient to notify staff if thoughts of harm toward self or others arise. Patient verbalize understanding and agreement. Will continue to monitor for safety.

## 2023-12-03 NOTE — Group Note (Signed)
 Group Topic: Communication  Group Date: 12/03/2023 Start Time: 0905 End Time: 1000 Facilitators: Herold Lajuana NOVAK, RN  Department: [facility]  Number of Participants: 5  Group Focus: communication Treatment Modality:  Leisure Development Interventions utilized were leisure development Purpose: increase insight  Name: Cynthia [name] Date of Birth: 01-20-1991  MR: [id number]    Level of Participation: active Quality of Participation: attentive Interactions with others: gave feedback Mood/Affect: appropriate Triggers (if applicable): none identified Cognition: coherent/clear Progress: Gaining insight Response: verbalized understanding of medication received  Patients Problems:  Patient Active Problem List   Diagnosis Date Noted   Alcohol abuse 11/28/2023   DM (diabetes mellitus), type 2 (HCC) 09/09/2017   Lactic acidosis 09/09/2017   Toxic encephalopathy 09/09/2017   MDD (major depressive disorder), recurrent severe, without psychosis (HCC)    Overdose 09/07/2017   Altered mental status    S/P total thyroidectomy 09/24/2015

## 2023-12-03 NOTE — Group Note (Signed)
 Group Topic: Recovery Basics  Group Date: 12/03/2023 Start Time: 1930 End Time: 2000 Facilitators: Verdon Jacqualyn BRAVO, NT  Department: [facility]  Number of Participants: 7  Group Focus: coping skills Treatment Modality:  Individual Therapy Interventions utilized were group exercise Purpose: express feelings  Name: Cynthia [name] Date of Birth: 1990-05-25  MR: [id number]    Level of Participation: active Quality of Participation: cooperative Interactions with others: gave feedback Mood/Affect: appropriate Triggers (if applicable): n/a Cognition: coherent/clear Progress: Moderate Response: n/a Plan: follow-up needed  Patients Problems:  Patient Active Problem List   Diagnosis Date Noted   Alcohol abuse 11/28/2023   DM (diabetes mellitus), type 2 (HCC) 09/09/2017   Lactic acidosis 09/09/2017   Toxic encephalopathy 09/09/2017   MDD (major depressive disorder), recurrent severe, without psychosis (HCC)    Overdose 09/07/2017   Altered mental status    S/P total thyroidectomy 09/24/2015

## 2023-12-03 NOTE — ED Notes (Signed)
 Patient is in the bedroom calm and composed.NAD. Respirations even and unlabored. Environment secured per policy. Denies SI/HI/AVH. Will monitor for safety.

## 2023-12-03 NOTE — Group Note (Signed)
 Group Topic: Emotional Regulation  Group Date: 12/03/2023 Start Time: 1315 End Time: 1400 Facilitators: Laneta Renea POUR, NT  Department: [facility]  Number of Participants: 3  Group Focus: coping skills Treatment Modality:  Psychoeducation Interventions utilized were patient education Purpose: enhance coping skills and increase insight  Name: Cynthia [name] Date of Birth: Dec 23, 1990  MR: [id number]    Level of Participation: active Quality of Participation: engaged and motivated Interactions with others: gave feedback Mood/Affect: appropriate, bright, and positive Triggers (if applicable): none Cognition: coherent/clear and insightful Progress: Gaining insight Response: I am doing this for me Plan: patient will be encouraged to attend groups.   Patients Problems:  Patient Active Problem List   Diagnosis Date Noted   Alcohol abuse 11/28/2023   DM (diabetes mellitus), type 2 (HCC) 09/09/2017   Lactic acidosis 09/09/2017   Toxic encephalopathy 09/09/2017   MDD (major depressive disorder), recurrent severe, without psychosis (HCC)    Overdose 09/07/2017   Altered mental status    S/P total thyroidectomy 09/24/2015

## 2023-12-04 DIAGNOSIS — Z79890 Hormone replacement therapy (postmenopausal): Secondary | ICD-10-CM | POA: Diagnosis not present

## 2023-12-04 DIAGNOSIS — F1010 Alcohol abuse, uncomplicated: Secondary | ICD-10-CM | POA: Diagnosis not present

## 2023-12-04 DIAGNOSIS — F339 Major depressive disorder, recurrent, unspecified: Secondary | ICD-10-CM | POA: Diagnosis not present

## 2023-12-04 DIAGNOSIS — Z7984 Long term (current) use of oral hypoglycemic drugs: Secondary | ICD-10-CM | POA: Diagnosis not present

## 2023-12-04 MED ORDER — B COMPLEX-C PO TABS
1.0000 | ORAL_TABLET | Freq: Every day | ORAL | Status: DC
  Filled 2023-12-04: qty 14

## 2023-12-04 MED ORDER — STRESS FORMULA/ZINC PO TABS: 1.0000 | ORAL_TABLET | Freq: Every day | ORAL | 0 refills | Status: DC

## 2023-12-04 MED ORDER — METFORMIN HCL ER 500 MG PO TB24: 500.0000 mg | ORAL_TABLET | Freq: Every day | ORAL | 0 refills | Status: DC

## 2023-12-04 MED ORDER — PANTOPRAZOLE SODIUM 40 MG PO TBEC: 40.0000 mg | DELAYED_RELEASE_TABLET | Freq: Every day | ORAL | 0 refills | Status: DC

## 2023-12-04 MED ORDER — TRAZODONE HCL 50 MG PO TABS: 50.0000 mg | ORAL_TABLET | Freq: Every evening | ORAL | 0 refills | Status: DC | PRN

## 2023-12-04 MED ORDER — LEVOTHYROXINE SODIUM 200 MCG PO TABS: 200.0000 ug | ORAL_TABLET | Freq: Every day | ORAL | 0 refills | Status: DC

## 2023-12-04 MED ORDER — SERTRALINE HCL 50 MG PO TABS: 50.0000 mg | ORAL_TABLET | Freq: Every day | ORAL | 0 refills | Status: DC
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
## 2023-12-04 NOTE — ED Notes (Signed)
 Patient is in the bedroom sleeping NAD. Will continue to monitor for safety.

## 2023-12-04 NOTE — Group Note (Signed)
 Group Topic: Balance in Life  Group Date: 12/04/2023 Start Time: 2000 End Time: 2100 Facilitators: Luvenia Mae SAUNDERS, NT  Department: [facility]  Number of Participants: 6  Group Focus: relapse prevention Treatment Modality:  Leisure Development Interventions utilized were story telling Purpose: express feelings  Name: NGUYET MERCER Date of Birth: 03/02/1991  MR: [id number]    Level of Participation: active Quality of Participation: attentive and cooperative Interactions with others: gave feedback Mood/Affect: appropriate Triggers (if applicable): NA Cognition: coherent/clear Progress: Gaining insight Response: NA Plan: patient will be encouraged to continue with group  Patients Problems:  Patient Active Problem List   Diagnosis Date Noted   Alcohol abuse 11/28/2023   DM (diabetes mellitus), type 2 (HCC) 09/09/2017   Lactic acidosis 09/09/2017   Toxic encephalopathy 09/09/2017   MDD (major depressive disorder), recurrent severe, without psychosis (HCC)    Overdose 09/07/2017   Altered mental status    S/P total thyroidectomy 09/24/2015

## 2023-12-04 NOTE — Group Note (Signed)
 Group Topic: Healthy Self Image and Positive Change  Group Date: 12/04/2023 Start Time: 0800 End Time: 0850 Facilitators: Lonzell Dwayne RAMAN, NT  Department: [facility]  Number of Participants: 3  Group Focus: anxiety Treatment Modality:  Psychoeducation Interventions utilized were patient education Purpose: reinforce self-care  Name: Cynthia [name] Date of Birth: February 26, 1991  MR: [id number]    Level of Participation: Patient did not attend group Quality of Participation: N/A Interactions with others: N/A Mood/Affect: N/A Triggers (if applicable): N/A Cognition: N/A Progress: N/A Response: N/A Plan: N/A  Patients Problems:  Patient Active Problem List   Diagnosis Date Noted   Alcohol abuse 11/28/2023   DM (diabetes mellitus), type 2 (HCC) 09/09/2017   Lactic acidosis 09/09/2017   Toxic encephalopathy 09/09/2017   MDD (major depressive disorder), recurrent severe, without psychosis (HCC)    Overdose 09/07/2017   Altered mental status    S/P total thyroidectomy 09/24/2015
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
## 2023-12-04 NOTE — ED Notes (Signed)
 Pt eating lunch, continues to deny concerns or complaints.

## 2023-12-04 NOTE — Group Note (Signed)
 Group Topic: Wellness  Group Date: 12/04/2023 Start Time: 0900 End Time: 1000 Facilitators: [name], RN  Department: [facility]  Number of Participants: 8  Group Focus: nursing group Treatment Modality:  Psychoeducation Interventions utilized were patient education Purpose: relapse prevention strategies  Name: Cynthia [name] Date of Birth: 1990-08-31  MR: [id number]    Level of Participation: active Quality of Participation: cooperative Interactions with others: gave feedback Mood/Affect: appropriate Triggers (if applicable): n/a Cognition: coherent/clear Progress: Gaining insight Response: RN reviewed and discussed medications with pt, questions denied.  Plan: patient will be encouraged to attend future RN education groups.   Patients Problems:  Patient Active Problem List   Diagnosis Date Noted   Alcohol abuse 11/28/2023   DM (diabetes mellitus), type 2 (HCC) 09/09/2017   Lactic acidosis 09/09/2017   Toxic encephalopathy 09/09/2017   MDD (major depressive disorder), recurrent severe, without psychosis (HCC)    Overdose 09/07/2017   Altered mental status    S/P total thyroidectomy 09/24/2015

## 2023-12-04 NOTE — ED Provider Notes (Signed)
 FBC/OBS ASAP Discharge Summary  Date and Time: 12/04/2023 3:30 PM  Name: Cynthia [name]  MRN:  [id number]   Discharge Diagnoses:  Final diagnoses:  Alcohol abuse  Recurrent major depressive disorder, remission status unspecified (HCC)    Subjective: patient is sleeping and eating regularly. No new physical complaints. Patient is future oriented and appropriate for discharge to [facility]   Stay Summary: During the course of patient's hospitalization, the 15-minute checks were adequate to ensure patient's safety. Patient did not exhibit erratic or aggressive behavior and was compliant with scheduled medication. Patient was recommended for outpatient psychiatry follow-up.  At the time of discharge patient is not reporting any acute suicidal/homicidal ideations/AVH, delusional thoughts or paranoia. Patient did not appear to be responding to any internal stimuli. Patient feels more confident about self-care & in managing their mental health problems. Patient currently denies any new issues or concerns. Education and supportive counseling provided throughout patient's hospital stay & upon discharge.  patient was safely detoxed fom alcohol during the course of her stay, and any symptoms of withdrawal were addressed and have since resolved. At time of discharge the patient is not experiencing clinical syptoms of withdrawal and denies having any subjective symptoms. At time of discharge CIWA was 0.   Today upon discharge evaluation, the patient gives a mood of appropriate to circumstances. Patient denies any specific concerns and has no new physical complaints. Patient slept well, appetite good, regular bowel movements. Patient feels that the medications have been helpful & is in agreement to continue current treatment regimen as recommended. Patient was able to engage in safety planning including plan to return to [facility]), the nearest emergency room or contact emergency services  if patient feels unable to maintain their own safety or the safety of others. Patient had no further questions, comments, or concerns. Patient [facility] with all personal belongings in no apparent distress. Transportation per safe transport to home was arranged for patient.  Total Time spent with patient: 15 minutes  Past Psychiatric History: Alcohol abuse, MDD, GAD Past Medical History: See chart Family History: Unknown Social History: Alcohol, delta 8 Tobacco Cessation:  N/A, patient does not currently use tobacco products  Current Medications:  Current Facility-Administered Medications  Medication Dose Route Frequency Provider Last Rate Last Admin   acetaminophen  (TYLENOL ) tablet 650 mg  650 mg Oral Q6H PRN Trudy Carwin, NP       alum & mag hydroxide-simeth (MAALOX/MYLANTA) 200-200-20 MG/5ML suspension 30 mL  30 mL Oral Q4H PRN Trudy Carwin, NP       haloperidol  (HALDOL ) tablet 5 mg  5 mg Oral TID PRN Trudy Carwin, NP       And   diphenhydrAMINE  (BENADRYL ) capsule 50 mg  50 mg Oral TID PRN Trudy Carwin, NP       feeding supplement (ENSURE PLUS HIGH PROTEIN) liquid 237 mL  237 mL Oral BID BM Aspasia Rude, Corean Massa, MD   237 mL at 12/04/23 1313   hydrOXYzine  (ATARAX ) tablet 25 mg  25 mg Oral Q6H PRN Trudy Carwin, NP       levothyroxine  (SYNTHROID ) tablet 200 mcg  200 mcg Oral Q0600 Trudy Carwin, NP   200 mcg at 12/04/23 9377   magnesium  hydroxide (MILK OF MAGNESIA) suspension 30 mL  30 mL Oral Daily PRN Trudy Carwin, NP       metFORMIN  (GLUCOPHAGE -XR) 24 hr tablet 500 mg  500 mg Oral Q breakfast Trudy Carwin, NP  500 mg at 12/04/23 0816   multivitamin with minerals tablet 1 tablet  1 tablet Oral Daily Trudy Carwin, NP   1 tablet at 12/04/23 0901   OLANZapine  (ZYPREXA ) injection 10 mg  10 mg Intramuscular TID PRN Trudy Carwin, NP       OLANZapine  (ZYPREXA ) injection 5 mg  5 mg Intramuscular TID PRN Trudy Carwin, NP       pantoprazole  (PROTONIX ) EC  tablet 40 mg  40 mg Oral Daily Glenisha Gundry, Corean Massa, MD   40 mg at 12/04/23 0902   sertraline  (ZOLOFT ) tablet 50 mg  50 mg Oral Daily Trudy Carwin, NP   50 mg at 12/04/23 9097   thiamine  (VITAMIN B1) injection 100 mg  100 mg Intramuscular Once Trudy Carwin, NP       thiamine  (VITAMIN B1) tablet 100 mg  100 mg Oral Daily Trudy Carwin, NP   100 mg at 12/04/23 9097   traZODone  (DESYREL ) tablet 50 mg  50 mg Oral QHS PRN Trudy Carwin, NP   50 mg at 12/03/23 2113   Current Outpatient Medications  Medication Sig Dispense Refill   Multiple Vitamins-Minerals (B COMPLEX-C-E-ZINC ) tablet Take 1 tablet by mouth daily. 30 tablet 0   [START ON 12/05/2023] levothyroxine  (SYNTHROID ) 200 MCG tablet Take 1 tablet (200 mcg total) by mouth daily at 6 (six) AM. 30 tablet 0   [START ON 12/05/2023] metFORMIN  (GLUCOPHAGE -XR) 500 MG 24 hr tablet Take 1 tablet (500 mg total) by mouth daily with breakfast. 30 tablet 0   [START ON 12/05/2023] pantoprazole  (PROTONIX ) 40 MG tablet Take 1 tablet (40 mg total) by mouth daily. 30 tablet 0   [START ON 12/05/2023] sertraline  (ZOLOFT ) 50 MG tablet Take 1 tablet (50 mg total) by mouth daily. 30 tablet 0   traZODone  (DESYREL ) 50 MG tablet Take 1 tablet (50 mg total) by mouth at bedtime as needed for sleep. 30 tablet 0    PTA Medications:  Facility Ordered Medications  Medication   acetaminophen  (TYLENOL ) tablet 650 mg   alum & mag hydroxide-simeth (MAALOX/MYLANTA) 200-200-20 MG/5ML suspension 30 mL   magnesium  hydroxide (MILK OF MAGNESIA) suspension 30 mL   haloperidol  (HALDOL ) tablet 5 mg   And   diphenhydrAMINE  (BENADRYL ) capsule 50 mg   OLANZapine  (ZYPREXA ) injection 5 mg   OLANZapine  (ZYPREXA ) injection 10 mg   levothyroxine  (SYNTHROID ) tablet 200 mcg   metFORMIN  (GLUCOPHAGE -XR) 24 hr tablet 500 mg   sertraline  (ZOLOFT ) tablet 50 mg   traZODone  (DESYREL ) tablet 50 mg   hydrOXYzine  (ATARAX ) tablet 25 mg   thiamine  (VITAMIN B1) injection 100 mg   thiamine  (VITAMIN B1)  tablet 100 mg   multivitamin with minerals tablet 1 tablet   [EXPIRED] LORazepam  (ATIVAN ) tablet 1 mg   [EXPIRED] hydrOXYzine  (ATARAX ) tablet 25 mg   [EXPIRED] loperamide  (IMODIUM ) capsule 2-4 mg   [EXPIRED] ondansetron  (ZOFRAN -ODT) disintegrating tablet 4 mg   feeding supplement (ENSURE PLUS HIGH PROTEIN) liquid 237 mL   [COMPLETED] chlordiazePOXIDE  (LIBRIUM ) capsule 25 mg   Followed by   [COMPLETED] chlordiazePOXIDE  (LIBRIUM ) capsule 10 mg   Followed by   [COMPLETED] chlordiazePOXIDE  (LIBRIUM ) capsule 5 mg   Followed by   [COMPLETED] chlordiazePOXIDE  (LIBRIUM ) capsule 5 mg   pantoprazole  (PROTONIX ) EC tablet 40 mg   [COMPLETED] neomycin -bacitracin -polymyxin [phone] OINT 1 Application   PTA Medications  Medication Sig   Multiple Vitamins-Minerals (B COMPLEX-C-E-ZINC ) tablet Take 1 tablet by mouth daily.   [START ON 12/05/2023] sertraline  (ZOLOFT ) 50 MG tablet Take 1 tablet (50 mg total)  by mouth daily.   traZODone  (DESYREL ) 50 MG tablet Take 1 tablet (50 mg total) by mouth at bedtime as needed for sleep.   [START ON 12/05/2023] levothyroxine  (SYNTHROID ) 200 MCG tablet Take 1 tablet (200 mcg total) by mouth daily at 6 (six) AM.   [START ON 12/05/2023] metFORMIN  (GLUCOPHAGE -XR) 500 MG 24 hr tablet Take 1 tablet (500 mg total) by mouth daily with breakfast.   [START ON 12/05/2023] pantoprazole  (PROTONIX ) 40 MG tablet Take 1 tablet (40 mg total) by mouth daily.       11/28/2023    8:39 PM  Depression screen PHQ 2/9  Decreased Interest 3  Down, Depressed, Hopeless 2  PHQ - 2 Score 5  Altered sleeping 1  Tired, decreased energy 1  Change in appetite 1  Feeling bad or failure about yourself  3  Trouble concentrating 2  Moving slowly or fidgety/restless 0  Suicidal thoughts 1  PHQ-9 Score 14    Flowsheet Row ED from 11/28/2023 in [facility] Most recent reading at 11/28/2023 10:08 PM ED from 11/28/2023 in [facility] Most recent  reading at 11/28/2023  6:59 PM ED from 05/19/2021 in [facility] Emergency Department at [facility] Most recent reading at 05/19/2021  8:18 PM  C-SSRS RISK CATEGORY Moderate Risk Moderate Risk No Risk    Musculoskeletal  Strength & Muscle Tone: within normal limits Gait & Station: normal Patient leans: N/A  Psychiatric Specialty Exam  Presentation  General Appearance:  Appropriate for Environment  Eye Contact: Good  Speech: Clear and Coherent  Speech Volume: Normal  Handedness: Right   Mood and Affect  Mood: Euthymic  Affect: Congruent   Thought Process  Thought Processes: Linear  Descriptions of Associations:Intact  Orientation:Full (Time, Place and Person)  Thought Content:Logical  Diagnosis of Schizophrenia or Schizoaffective disorder in past: No    Hallucinations:Hallucinations: None  Ideas of Reference:None  Suicidal Thoughts:Suicidal Thoughts: No  Homicidal Thoughts:Homicidal Thoughts: No   Sensorium  Memory: Immediate Good; Remote Good  Judgment: Good  Insight: Good   Executive Functions  Concentration: Good  Attention Span: Good  Recall: Good  Fund of Knowledge: Good  Language: Good   Psychomotor Activity  Psychomotor Activity: Psychomotor Activity: Normal   Assets  Assets: Communication Skills; Desire for Improvement   Sleep  Sleep: Sleep: Good  Estimated Sleeping Duration (Last 24 Hours): 10.25-11.00 hours  No data recorded  Physical Exam  Physical Exam Vitals and nursing note reviewed.  Constitutional:      Appearance: Normal appearance.  HENT:     Head: Normocephalic.  Eyes:     Extraocular Movements: Extraocular movements intact.  Pulmonary:     Effort: Pulmonary effort is normal.  Musculoskeletal:        General: Normal range of motion.     Cervical back: Normal range of motion.  Neurological:     General: No focal deficit present.     Mental Status: She is alert and oriented to  person, place, and time.  Psychiatric:        Behavior: Behavior normal.    Review of Systems  Constitutional:  Negative for fever.  Respiratory:  Negative for cough.   Cardiovascular:  Negative for chest pain.  Gastrointestinal:  Negative for constipation, diarrhea, nausea and vomiting.  Genitourinary:  Negative for dysuria.  Musculoskeletal:  Negative for joint pain and myalgias.  Skin:  Positive for rash.  Neurological:  Negative for headaches.  Psychiatric/Behavioral:  Negative for  hallucinations and suicidal ideas.    Blood pressure 107/75, pulse 81, temperature 97.9 F (36.6 C), temperature source Oral, resp. rate 18, SpO2 91%. There is no height or weight on file to calculate BMI.  Demographic Factors:  Caucasian  Loss Factors: Financial problems/change in socioeconomic status  Historical Factors: NA  Risk Reduction Factors:   Positive coping skills or problem solving skills  Continued Clinical Symptoms:  Alcohol/Substance Abuse/Dependencies  Cognitive Features That Contribute To Risk:  None    Suicide Risk:  Minimal: No identifiable suicidal ideation.  Patients presenting with no risk factors but with morbid ruminations; may be classified as minimal risk based on the severity of the depressive symptoms  Plan Of Care/Follow-up recommendations:  Activity:  as tolerated Diet:  low sodium, low fat  Disposition: discharge to [facility], [place] 12/04/2023, 3:30 PM

## 2023-12-05 DIAGNOSIS — F1010 Alcohol abuse, uncomplicated: Secondary | ICD-10-CM | POA: Diagnosis not present

## 2023-12-05 DIAGNOSIS — Z7984 Long term (current) use of oral hypoglycemic drugs: Secondary | ICD-10-CM | POA: Diagnosis not present

## 2023-12-05 DIAGNOSIS — F339 Major depressive disorder, recurrent, unspecified: Secondary | ICD-10-CM | POA: Diagnosis not present

## 2023-12-05 DIAGNOSIS — Z79890 Hormone replacement therapy (postmenopausal): Secondary | ICD-10-CM | POA: Diagnosis not present

## 2023-12-05 MED ORDER — SERTRALINE HCL 50 MG PO TABS: 50.0000 mg | ORAL_TABLET | Freq: Every day | ORAL | 0 refills | Status: AC

## 2023-12-05 MED ORDER — NALTREXONE HCL 50 MG PO TABS
50.0000 mg | ORAL_TABLET | Freq: Every day | ORAL | Status: DC
  Filled 2023-12-05: qty 14

## 2023-12-05 MED ORDER — STRESS FORMULA/ZINC PO TABS: 1.0000 | ORAL_TABLET | Freq: Every day | ORAL | 0 refills | Status: AC

## 2023-12-05 MED ORDER — NALTREXONE HCL 50 MG PO TABS: 50.0000 mg | ORAL_TABLET | Freq: Once | ORAL | 0 refills | Status: DC

## 2023-12-05 MED ORDER — NALTREXONE HCL 50 MG PO TABS: 50.0000 mg | ORAL_TABLET | Freq: Every day | ORAL | 0 refills | Status: AC

## 2023-12-05 MED ORDER — TRAZODONE HCL 50 MG PO TABS: 50.0000 mg | ORAL_TABLET | Freq: Every evening | ORAL | 0 refills | Status: AC | PRN

## 2023-12-05 MED ORDER — LEVOTHYROXINE SODIUM 200 MCG PO TABS: 200.0000 ug | ORAL_TABLET | Freq: Every day | ORAL | 0 refills | Status: AC

## 2023-12-05 MED ORDER — METFORMIN HCL ER 500 MG PO TB24: 500.0000 mg | ORAL_TABLET | Freq: Every day | ORAL | 0 refills | Status: DC

## 2023-12-05 MED ORDER — PANTOPRAZOLE SODIUM 40 MG PO TBEC: 40.0000 mg | DELAYED_RELEASE_TABLET | Freq: Every day | ORAL | 0 refills | Status: AC

## 2023-12-05 MED ORDER — NALTREXONE HCL 50 MG PO TABS
50.0000 mg | ORAL_TABLET | Freq: Once | ORAL | Status: AC
  Administered 2023-12-05: 50 mg via ORAL
  Filled 2023-12-05: qty 1

## 2023-12-05 NOTE — Progress Notes (Signed)
 Pt is awake and alert x4 .   Bright affect and congruent mood. Pt reports that she is looking forward to discharging to [facility].  Denies SI, HI or AVH.   No distress noted.

## 2023-12-05 NOTE — ED Provider Notes (Signed)
 FBC/OBS ASAP Discharge Summary  Date and Time: 12/05/2023 8:24 AM  Name: Cynthia [name]  MRN:  [id number]   Discharge Diagnoses:  Final diagnoses:  Alcohol abuse  Recurrent major depressive disorder, remission status unspecified (HCC)    Subjective: Patient eager for discharge today. Generally good mood with some anxiety. Sleep good with trazodone , no medication issues. Patient is sleeping and eating regularly. No new physical complaints. Patient is future oriented and appropriate for discharge to [facility]. She would prefer programs such as [facility] (previous) or Administrator, sports House/[facility]) but willing to start with [facility]. Hopeful for better outcome now that she's committed to change due to the impact alcohol has had on her life. Now open to MAT as well. Open to trying naltrexone  now. Long-standing history of elevated transaminases when drinking but endorses periods of normal hepatic enzymes. Patient anticipates father picking her up today to spend time with family and collect some belongings for afternoon admission to [facility]. She has confirmed this plan with Melissa @[facility] .  Stay Summary: During the course of patient's hospitalization, the 15-minute checks were adequate to ensure patient's safety. Patient did not exhibit erratic or aggressive behavior and was compliant with scheduled medication. Patient was recommended for outpatient psychiatry follow-up.  At the time of discharge patient is not reporting any acute suicidal/homicidal ideations/AVH, delusional thoughts or paranoia. Patient did not appear to be responding to any internal stimuli. Patient feels more confident about self-care & in managing their mental health problems. Patient currently denies any new issues or concerns. Education and supportive counseling provided throughout patient's hospital stay & upon discharge.  Patient was safely detoxed fom alcohol during the course of her stay, and any symptoms of withdrawal were  addressed and have since resolved. At time of discharge the patient is not experiencing clinical syptoms of withdrawal and denies having any subjective symptoms. Resumed sertraline  for dysthymia/anxiety. Trazodone  prn effective and well-tolerated for insomnia. At time of discharge CIWA was 0.   Today upon discharge evaluation, the patient gives a mood of appropriate to circumstances. Patient denies any specific concerns and has no new physical complaints. Patient slept well, appetite good, regular bowel movements. Patient feels that the medications have been helpful & is in agreement to continue current treatment regimen as recommended. Patient was able to engage in safety planning including plan to return to [facility] the nearest emergency room or contact emergency services if patient feels unable to maintain their own safety or the safety of others. Patient had no further questions, comments, or concerns. Patient left [facility]) with all personal belongings in no apparent distress.   Total Time spent with patient: 15 minutes  Past Psychiatric History: Alcohol abuse, MDD, GAD Past Medical History: See chart Family History: Unknown Social History: Alcohol, delta 8 Tobacco Cessation:  N/A, patient does not currently use tobacco products  Current Medications:  Current Facility-Administered Medications  Medication Dose Route Frequency Provider Last Rate Last Admin   acetaminophen  (TYLENOL ) tablet 650 mg  650 mg Oral Q6H PRN Trudy Carwin, NP       alum & mag hydroxide-simeth (MAALOX/MYLANTA) 200-200-20 MG/5ML suspension 30 mL  30 mL Oral Q4H PRN Trudy Carwin, NP       haloperidol  (HALDOL ) tablet 5 mg  5 mg Oral TID PRN Trudy Carwin, NP       And   diphenhydrAMINE  (BENADRYL ) capsule 50 mg  50 mg Oral TID PRN Trudy Carwin, NP  feeding supplement (ENSURE PLUS HIGH PROTEIN) liquid 237 mL  237 mL Oral BID BM Hill, Corean Massa, MD   237 mL at  12/04/23 1313   hydrOXYzine  (ATARAX ) tablet 25 mg  25 mg Oral Q6H PRN Trudy Carwin, NP   25 mg at 12/05/23 0745   levothyroxine  (SYNTHROID ) tablet 200 mcg  200 mcg Oral Q0600 Trudy Carwin, NP   200 mcg at 12/05/23 0631   magnesium  hydroxide (MILK OF MAGNESIA) suspension 30 mL  30 mL Oral Daily PRN Trudy Carwin, NP       metFORMIN  (GLUCOPHAGE -XR) 24 hr tablet 500 mg  500 mg Oral Q breakfast Trudy Carwin, NP   500 mg at 12/05/23 0742   multivitamin with minerals tablet 1 tablet  1 tablet Oral Daily Trudy Carwin, NP   1 tablet at 12/05/23 9257   OLANZapine  (ZYPREXA ) injection 10 mg  10 mg Intramuscular TID PRN Trudy Carwin, NP       OLANZapine  (ZYPREXA ) injection 5 mg  5 mg Intramuscular TID PRN Trudy Carwin, NP       pantoprazole  (PROTONIX ) EC tablet 40 mg  40 mg Oral Daily Hill, Corean Massa, MD   40 mg at 12/05/23 0745   sertraline  (ZOLOFT ) tablet 50 mg  50 mg Oral Daily Trudy Carwin, NP   50 mg at 12/05/23 9258   thiamine  (VITAMIN B1) injection 100 mg  100 mg Intramuscular Once Trudy Carwin, NP       thiamine  (VITAMIN B1) tablet 100 mg  100 mg Oral Daily Trudy Carwin, NP   100 mg at 12/05/23 9256   traZODone  (DESYREL ) tablet 50 mg  50 mg Oral QHS PRN Trudy Carwin, NP   50 mg at 12/04/23 2158   Current Outpatient Medications  Medication Sig Dispense Refill   levothyroxine  (SYNTHROID ) 200 MCG tablet Take 1 tablet (200 mcg total) by mouth daily at 6 (six) AM. 30 tablet 0   metFORMIN  (GLUCOPHAGE -XR) 500 MG 24 hr tablet Take 1 tablet (500 mg total) by mouth daily with breakfast. 30 tablet 0   Multiple Vitamins-Minerals (B COMPLEX-C-E-ZINC ) tablet Take 1 tablet by mouth daily. 30 tablet 0   pantoprazole  (PROTONIX ) 40 MG tablet Take 1 tablet (40 mg total) by mouth daily. 30 tablet 0   sertraline  (ZOLOFT ) 50 MG tablet Take 1 tablet (50 mg total) by mouth daily. 30 tablet 0   traZODone  (DESYREL ) 50 MG tablet Take 1 tablet (50 mg total) by mouth at bedtime as needed for sleep. 30 tablet 0     PTA Medications:  Facility Ordered Medications  Medication   acetaminophen  (TYLENOL ) tablet 650 mg   alum & mag hydroxide-simeth (MAALOX/MYLANTA) 200-200-20 MG/5ML suspension 30 mL   magnesium  hydroxide (MILK OF MAGNESIA) suspension 30 mL   haloperidol  (HALDOL ) tablet 5 mg   And   diphenhydrAMINE  (BENADRYL ) capsule 50 mg   OLANZapine  (ZYPREXA ) injection 5 mg   OLANZapine  (ZYPREXA ) injection 10 mg   levothyroxine  (SYNTHROID ) tablet 200 mcg   metFORMIN  (GLUCOPHAGE -XR) 24 hr tablet 500 mg   sertraline  (ZOLOFT ) tablet 50 mg   traZODone  (DESYREL ) tablet 50 mg   hydrOXYzine  (ATARAX ) tablet 25 mg   thiamine  (VITAMIN B1) injection 100 mg   thiamine  (VITAMIN B1) tablet 100 mg   multivitamin with minerals tablet 1 tablet   [EXPIRED] LORazepam  (ATIVAN ) tablet 1 mg   [EXPIRED] hydrOXYzine  (ATARAX ) tablet 25 mg   [EXPIRED] loperamide  (IMODIUM ) capsule 2-4 mg   [EXPIRED] ondansetron  (ZOFRAN -ODT) disintegrating tablet 4 mg   feeding supplement (ENSURE  PLUS HIGH PROTEIN) liquid 237 mL   [COMPLETED] chlordiazePOXIDE  (LIBRIUM ) capsule 25 mg   Followed by   [COMPLETED] chlordiazePOXIDE  (LIBRIUM ) capsule 10 mg   Followed by   [COMPLETED] chlordiazePOXIDE  (LIBRIUM ) capsule 5 mg   Followed by   [COMPLETED] chlordiazePOXIDE  (LIBRIUM ) capsule 5 mg   pantoprazole  (PROTONIX ) EC tablet 40 mg   [COMPLETED] neomycin -bacitracin -polymyxin [phone] OINT 1 Application   PTA Medications  Medication Sig   sertraline  (ZOLOFT ) 50 MG tablet Take 1 tablet (50 mg total) by mouth daily.   levothyroxine  (SYNTHROID ) 200 MCG tablet Take 1 tablet (200 mcg total) by mouth daily at 6 (six) AM.   metFORMIN  (GLUCOPHAGE -XR) 500 MG 24 hr tablet Take 1 tablet (500 mg total) by mouth daily with breakfast.   pantoprazole  (PROTONIX ) 40 MG tablet Take 1 tablet (40 mg total) by mouth daily.   Multiple Vitamins-Minerals (B COMPLEX-C-E-ZINC ) tablet Take 1 tablet by mouth daily.   traZODone  (DESYREL ) 50 MG tablet Take 1  tablet (50 mg total) by mouth at bedtime as needed for sleep.       11/28/2023    8:39 PM  Depression screen PHQ 2/9  Decreased Interest 3  Down, Depressed, Hopeless 2  PHQ - 2 Score 5  Altered sleeping 1  Tired, decreased energy 1  Change in appetite 1  Feeling bad or failure about yourself  3  Trouble concentrating 2  Moving slowly or fidgety/restless 0  Suicidal thoughts 1  PHQ-9 Score 14    Flowsheet Row ED from 11/28/2023 in [facility] Most recent reading at 11/28/2023 10:08 PM ED from 11/28/2023 in [facility] Most recent reading at 11/28/2023  6:59 PM ED from 05/19/2021 in [facility] Emergency Department at [facility] Most recent reading at 05/19/2021  8:18 PM  C-SSRS RISK CATEGORY Moderate Risk Moderate Risk No Risk    Musculoskeletal  Strength & Muscle Tone: within normal limits Gait & Station: normal Patient leans: N/A  Psychiatric Specialty Exam  Presentation  General Appearance:  Appropriate for Environment  Eye Contact: Good  Speech: Clear and Coherent Talkative, verbose  Speech Volume: Normal  Handedness: Right   Mood and Affect  Mood: Euthymic  Affect: Congruent  Thought Process  Thought Processes: Linear  Descriptions of Associations:Intact  Orientation:Full (Time, Place and Person)  Thought Content:Logical  Diagnosis of Schizophrenia or Schizoaffective disorder in past: No    Hallucinations:Hallucinations: None  Ideas of Reference:None  Suicidal Thoughts:Suicidal Thoughts: No  Homicidal Thoughts:Homicidal Thoughts: No  Sensorium  Memory: Immediate Good; Remote Good  Judgment: Good  Insight: Good  Executive Functions  Concentration: Good  Attention Span: Good  Recall: Good  Fund of Knowledge: Good  Language: Good  Psychomotor Activity  Psychomotor Activity: Psychomotor Activity: Normal  Assets  Assets: Communication Skills; Desire for  Improvement  Sleep  Sleep: Sleep: Good  Estimated Sleeping Duration (Last 24 Hours): 8.00-8.50 hours  No data recorded  Physical Exam  Physical Exam Vitals and nursing note reviewed.  Constitutional:      Appearance: Normal appearance.  HENT:     Head: Normocephalic.  Eyes:     Extraocular Movements: Extraocular movements intact.  Pulmonary:     Effort: Pulmonary effort is normal.  Musculoskeletal:        General: Normal range of motion.     Cervical back: Normal range of motion.  Neurological:     General: No focal deficit present.     Mental Status: She is alert and  oriented to person, place, and time.  Psychiatric:        Behavior: Behavior normal.    Review of Systems  Constitutional:  Negative for fever.  Respiratory:  Negative for cough.   Cardiovascular:  Negative for chest pain.  Gastrointestinal:  Negative for constipation, diarrhea, nausea and vomiting.  Genitourinary:  Negative for dysuria.  Musculoskeletal:  Negative for joint pain and myalgias.  Skin:  Positive for rash.  Neurological:  Negative for headaches.  Psychiatric/Behavioral:  Negative for hallucinations and suicidal ideas.    Blood pressure 107/75, pulse 81, temperature 97.9 F (36.6 C), temperature source Oral, resp. rate 18, SpO2 91%. There is no height or weight on file to calculate BMI.  Demographic Factors:  Caucasian  Loss Factors: Financial problems/change in socioeconomic status  Historical Factors: NA  Risk Reduction Factors:   Positive coping skills or problem solving skills  Continued Clinical Symptoms:  Alcohol/Substance Abuse/Dependencies  Cognitive Features That Contribute To Risk:  None    Suicide Risk:  Minimal: No identifiable suicidal ideation.  Patients presenting with no risk factors but with morbid ruminations; may be classified as minimal risk based on the severity of the depressive symptoms  Plan Of Care/Follow-up recommendations:   # It is recommended  to the patient to continue psychiatric and somatic medications as prescribed, after discharge.    # It is recommended to the patient to follow up with your outpatient psychiatric provider and PCP. # Testing: Follow-up with outpatient provider for abnormal lab results: elevated transaminases # It was discussed with the patient, the impact of alcohol, drugs, tobacco have been there overall psychiatric and medical wellbeing, and total abstinence from substance use was recommended.  # Prescriptions provided in addition to 14d supply of medications. Patient agreeable to plan. Given opportunity to ask questions. Appears to feel comfortable with discharge. Extensive discussion of pharmacology of naltrexone , potential transient impact on hepatic enzymes, and critical need to reduce alcohol intake and discuss care with outpatient providers. #Activity: Daily physical activity; reduce sedentary behaviors.  #Diet: Portion-limited diet rich in produce, whole (minimally processed) grains, nuts/seeds, beans/legumes, seafood, eggs, fermented food, lowfat dairy (if tolerated), lean meat. Copious water intake. Limit (ultra)processed foods and sugar-sweetened beverages.  # In the event of worsening symptoms, the patient is instructed to call your outpatient psychiatric provider, 911, [phone] or go to the nearest emergency department for appropriate evaluation and treatment of symptoms.  # Patient was discharged with a plan to follow up as noted previously.  Disposition: discharge to [facility], MD 12/05/2023, 8:24 AM

## 2023-12-05 NOTE — ED Notes (Addendum)
 Pt given AVS along with prescriptions as ordered.  Pt denies withdrawal signs and symptoms.  Pt was also given back all her belongings from locker 13. Her father is currently here to transport her to [facility] provider is aware that she will not need a taxi voucher.  .   No distress noted.   Late entry for 10:00am.  It was discovered by this staff person that  medication samples were not provided to the pt as previously indicated.  She was called via her sister and instructed to return to [facility].  Two week samples of medications were provided to pt and she left with her father.    Within Five minutes Dr. Cole came to this nurse and stated that pt also needed a prescription and samples of Naltrexone .  Pt was called again and notified to return.  Pharmacy provided samples and prescription was given to pt around 1015.    [facility] was called by this staff person and informed that pt was on her way from [facility].

## 2023-12-07 ENCOUNTER — Ambulatory Visit (HOSPITAL_COMMUNITY): Payer: MEDICAID

## 2023-12-07 ENCOUNTER — Telehealth (HOSPITAL_COMMUNITY): Payer: Self-pay | Admitting: Licensed Clinical Social Worker

## 2023-12-07 NOTE — Telephone Encounter (Signed)
 After Denora no shows for her assessment today, the therapist attempts to reach her by phone leaving a HIPAA-compliant voicemail.  [name], MA, LCSW, [facility], LCAS 12/07/2023

## 2023-12-19 ENCOUNTER — Emergency Department (HOSPITAL_BASED_OUTPATIENT_CLINIC_OR_DEPARTMENT_OTHER)
Admission: EM | Admit: 2023-12-19 | Discharge: 2023-12-19 | Disposition: A | Discharge status: Home / Self Care | Payer: MEDICAID | Attending: Emergency Medicine | Admitting: Emergency Medicine

## 2023-12-19 ENCOUNTER — Encounter (HOSPITAL_BASED_OUTPATIENT_CLINIC_OR_DEPARTMENT_OTHER): Payer: Self-pay

## 2023-12-19 ENCOUNTER — Other Ambulatory Visit: Payer: Self-pay

## 2023-12-19 DIAGNOSIS — E11649 Type 2 diabetes mellitus with hypoglycemia without coma: Secondary | ICD-10-CM | POA: Insufficient documentation

## 2023-12-19 DIAGNOSIS — Z85850 Personal history of malignant neoplasm of thyroid: Secondary | ICD-10-CM | POA: Insufficient documentation

## 2023-12-19 DIAGNOSIS — E162 Hypoglycemia, unspecified: Secondary | ICD-10-CM

## 2023-12-19 DIAGNOSIS — E039 Hypothyroidism, unspecified: Secondary | ICD-10-CM | POA: Insufficient documentation

## 2023-12-19 LAB — CBG MONITORING, ED: Glucose-Capillary: 83 mg/dL (ref 70–99)

## 2023-12-19 NOTE — ED Provider Notes (Signed)
 Emergency Department Provider Note   I have reviewed the triage vital signs and the nursing notes.   HISTORY  Chief Complaint Hypoglycemia   HPI Cynthia [name] is a 33 y.o. female past history of type 2 diabetes and alcoholism presents to the emergency department from [facility] with concern for low blood sugars in the morning.  Patient is on 5 mg of glipizide daily and 500 mg metformin  twice daily.  She states she has been given her metformin  consistently and she feels like some of the side effects are keeping her from eating regularly.  She has some associated gastroparesis but is not currently having a severe flare of this.  Her blood sugar this morning was 54 and she checked out to come be seen in the ED.  She is feeling well at this time.  She is not having lightheadedness, diaphoresis, shaking, or confusion.  No vomiting or diarrhea.  She is requesting that her metformin  be discontinued and she can return to [facility] for treatment.  She notes that when her appetite increases she may have to restart the metformin .   Past Medical History:  Diagnosis Date   Cancer (HCC)    thyroid    Depression    GERD (gastroesophageal reflux disease)    Hypothyroidism     Review of Systems  Constitutional: No fever/chills Cardiovascular: Denies chest pain. Respiratory: Denies shortness of breath. Gastrointestinal: No abdominal pain. Intermittent nausea, no vomiting.   Skin: Negative for rash. Neurological: Negative for headaches.  ____________________________________________   PHYSICAL EXAM:  VITAL SIGNS: ED Triage Vitals  Encounter Vitals Group     BP 12/19/23 0900 123/87     Pulse Rate 12/19/23 0900 85     Resp 12/19/23 0900 16     Temp 12/19/23 0900 98.4 F (36.9 C)     Temp Source 12/19/23 0900 Oral     SpO2 12/19/23 0900 100 %     Weight 12/19/23 0901 117 lb (53.1 kg)   Constitutional: Alert and oriented. Well appearing and in no acute distress. Eyes: Conjunctivae are  normal.  Head: Atraumatic. Nose: No congestion/rhinnorhea. Mouth/Throat: Mucous membranes are moist.  Neck: No stridor.  Cardiovascular: Good peripheral circulation.  Respiratory: Normal respiratory effort.   Gastrointestinal: Soft and nontender. No distention.  Musculoskeletal: No gross deformities of extremities. Neurologic:  Normal speech and language. Skin:  Skin is warm, dry and intact. No rash noted.  ____________________________________________   LABS (all labs ordered are listed, but only abnormal results are displayed)  Labs Reviewed  CBG MONITORING, ED    ____________________________________________   PROCEDURES  Procedure(s) performed:   Procedures  None  ____________________________________________   INITIAL IMPRESSION / ASSESSMENT AND PLAN / ED COURSE  Pertinent labs & imaging results that were available during my care of the patient were reviewed by me and considered in my medical decision making (see chart for details).   This patient is Presenting for Evaluation of hypoglycemia, which does require a range of treatment options, and is a complaint that involves a high risk of morbidity and mortality.  The Differential Diagnoses include medication adjustment needed, severe hypoglycemia, AKI, etc.  Clinical Laboratory Tests Ordered, included CBG this AM is 83.   Medical Decision Making: Summary: The patient presents emergency department with hypoglycemia episodes in the morning.  She notes that her appetite is last while undergoing treatment at [facility]  We discussed potential changes to both her glipizide and metformin .  Her impression is that the metformin  is causing fullness  and making it more difficult for her to eat.  We discussed that the glipizide can cause hypoglycemia but would like to start with the metformin  which seems reasonable.  Her vital signs are reassuring.  I do not see indication for emergent blood work here.  Blood sugar here is  unremarkable.  Patient's presentation is most consistent with acute, uncomplicated illness.   Disposition: discharge  ____________________________________________  FINAL CLINICAL IMPRESSION(S) / ED DIAGNOSES  Final diagnoses:  Hypoglycemia     Note:  This document was prepared using Dragon voice recognition software and may include unintentional dictation errors.  Fonda Law, MD, [facility]    Orpah Hausner, Fonda MATSU, MD 12/19/23 [phone]

## 2023-12-19 NOTE — ED Triage Notes (Signed)
 Sent from [facility]  States blood sugar has been running low for 3 weeks. States this morning was 54. Reports coming to ED to figure out proper dosing of metformin , states she is unsure if she needs to be on medication anymore  Denies lightheadedness, disorientation, diaphoresis

## 2023-12-19 NOTE — Discharge Instructions (Signed)
 You may stop taking your metformin  for now.  I would like for you to continue your glipizide 5 mg daily as prescribed.  If your appetite increases you may need to restart the metformin .  Please call your endocrinologist and primary care doctor to coordinate once your treatment is complete with [facility].

## 2023-12-19 NOTE — ED Notes (Signed)
 Spoke w [facility] for patient pick up.

## 2024-04-01 ENCOUNTER — Emergency Department (HOSPITAL_BASED_OUTPATIENT_CLINIC_OR_DEPARTMENT_OTHER)
Admission: EM | Admit: 2024-04-01 | Discharge: 2024-04-01 | Disposition: A | Discharge status: Home / Self Care | Payer: MEDICAID | Attending: Emergency Medicine | Admitting: Emergency Medicine

## 2024-04-01 ENCOUNTER — Encounter (HOSPITAL_BASED_OUTPATIENT_CLINIC_OR_DEPARTMENT_OTHER): Payer: Self-pay | Admitting: Emergency Medicine

## 2024-04-01 ENCOUNTER — Other Ambulatory Visit: Payer: Self-pay

## 2024-04-01 DIAGNOSIS — E1165 Type 2 diabetes mellitus with hyperglycemia: Secondary | ICD-10-CM | POA: Insufficient documentation

## 2024-04-01 DIAGNOSIS — R739 Hyperglycemia, unspecified: Secondary | ICD-10-CM | POA: Diagnosis present

## 2024-04-01 DIAGNOSIS — Z8639 Personal history of other endocrine, nutritional and metabolic disease: Secondary | ICD-10-CM

## 2024-04-01 LAB — CBG MONITORING, ED: Glucose-Capillary: 192 mg/dL — ABNORMAL HIGH (ref 70–99)

## 2024-04-01 NOTE — Discharge Instructions (Signed)
 It was a pleasure taking care of you here today  Your blood sugar today was normal at 192  I did make sure to cut back on sugary beverages and carbohydrates, drink plenty fluids  Make sure to follow-up with your endocrinology  Return for new or worsening symptoms

## 2024-04-01 NOTE — ED Provider Notes (Signed)
 " [place] [facility] Provider Note   CSN: [id number] Arrival date & time: 04/01/24  2153     Patient presents with: Hyperglycemia   Cynthia [name] is a 34 y.o. female here for eval of hyperglycemia.  Takes glipizide.  States had dose reduced to 2.5 about a month and a half ago as she was having nocturnal hypoglycemia.  She is at [facility] has been there since September.  Has been sober from alcohol since.  States she is been doing otherwise well.  She states she drank any water today and was eating candy and high carbohydrate foods.  Had her blood sugar checked by the nighttime nurse at [facility] her blood sugar was greater than 300 and was subsequently sent here due to it being outside the parameters.  Patient denies any symptoms such as polyuria, polydipsia, abdominal pain, nausea, vomiting, vision changes.  She states she feels fine.  EMS checked blood sugar which was 265   HPI     Prior to Admission medications  Medication Sig Start Date End Date Taking? Authorizing Provider  levothyroxine  (SYNTHROID ) 200 MCG tablet Take 1 tablet (200 mcg total) by mouth daily at 6 (six) AM. 12/05/23   Cole Kandi BROCKS, MD  Multiple Vitamins-Minerals (B COMPLEX-C-E-ZINC ) tablet Take 1 tablet by mouth daily. 12/05/23   Cole Kandi BROCKS, MD  naltrexone  (DEPADE) 50 MG tablet Take 1 tablet (50 mg total) by mouth daily. 12/05/23   Cole Kandi BROCKS, MD  pantoprazole  (PROTONIX ) 40 MG tablet Take 1 tablet (40 mg total) by mouth daily. 12/05/23   Cole Kandi BROCKS, MD  sertraline  (ZOLOFT ) 50 MG tablet Take 1 tablet (50 mg total) by mouth daily. 12/05/23   Cole Kandi BROCKS, MD  traZODone  (DESYREL ) 50 MG tablet Take 1 tablet (50 mg total) by mouth at bedtime as needed for sleep. 12/05/23   Cole Kandi BROCKS, MD    Allergies: Bupropion    Review of Systems  Constitutional: Negative.   HENT: Negative.    Respiratory: Negative.    Cardiovascular: Negative.    Gastrointestinal: Negative.   Genitourinary: Negative.   Musculoskeletal: Negative.   Skin: Negative.   Neurological: Negative.   All other systems reviewed and are negative.   Updated Vital Signs BP 125/86 (BP Location: Right Arm)   Pulse 72   Temp 98.2 F (36.8 C) (Oral)   Resp 18   Ht 5' 4 (1.626 m)   Wt 63.5 kg   SpO2 100%   BMI 24.03 kg/m   Physical Exam Vitals and nursing note reviewed.  Constitutional:      General: She is not in acute distress.    Appearance: She is well-developed. She is not ill-appearing, toxic-appearing or diaphoretic.  HENT:     Head: Atraumatic.     Nose: Nose normal.     Mouth/Throat:     Mouth: Mucous membranes are moist.  Eyes:     Pupils: Pupils are equal, round, and reactive to light.  Cardiovascular:     Rate and Rhythm: Normal rate.     Pulses: Normal pulses.     Heart sounds: Normal heart sounds.  Pulmonary:     Effort: Pulmonary effort is normal. No respiratory distress.     Breath sounds: Normal breath sounds.  Abdominal:     General: Bowel sounds are normal. There is no distension.     Palpations: Abdomen is soft.  Musculoskeletal:        General: Normal range  of motion.     Cervical back: Normal range of motion.  Skin:    General: Skin is warm and dry.  Neurological:     General: No focal deficit present.     Mental Status: She is alert.  Psychiatric:        Mood and Affect: Mood normal.     (all labs ordered are listed, but only abnormal results are displayed) Labs Reviewed  CBG MONITORING, ED - Abnormal; Notable for the following components:      Result Value   Glucose-Capillary 192 (*)    All other components within normal limits    EKG: None  Radiology: No results found.   Procedures   Medications Ordered in the ED - No data to display  34 year old here for evaluation of hyperglycemia.  Type II diabetic on glipizide.  Had dose reduced about a month and a half ago from endocrinology due to  nocturnal hypoglycemia.  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  Gets her blood pressures checked morning and evening.  She states she has not had any water today, drinking sugary beverages, eating candy and height, hydrate meals.  She think this is likely contributing to her hyperglycemia.  She is currently asymptomatic.  No tenderness checked her blood sugar which was 300 and subsequently outside of their parameters and sent here.  EMS blood sugar 265.  Labs personally viewed interpreted CBG shows 192   Discussed results with patient.  Offered blood work, urine however at this point would not treat a blood sugar of 192.  She has stable vital signs I will suspicion for hyperglycemic crisis such as DKA, HHS.  Patient declines further workup which I feel is reasonable.  Discussed drinking plenty of water, reducing carbohydrate intake, sweets over the next few days, follow-up with endocrinology.  She will return for any worsening symptoms.  The patient has been appropriately medically screened and/or stabilized in the ED. I have low suspicion for any other emergent medical condition which would require further screening, evaluation or treatment in the ED or require inpatient management.  Patient is hemodynamically stable and in no acute distress.  Patient able to ambulate in department prior to ED.  Evaluation does not show acute pathology that would require ongoing or additional emergent interventions while in the emergency department or further inpatient treatment.  I have discussed the diagnosis with the patient and answered all questions.  Pain is been managed while in the emergency department and patient has no further complaints prior to discharge.  Patient is comfortable with plan discussed in room and is stable for discharge at this time.  I have discussed strict return precautions for returning to  the emergency department.  Patient was encouraged to follow-up with PCP/specialist refer to at discharge.                                    Medical Decision Making Amount and/or Complexity of Data Reviewed Independent Historian: EMS External Data Reviewed: labs, radiology and notes. Labs: ordered. Decision-making details documented in ED Course.  Risk OTC drugs. Decision regarding hospitalization. Diagnosis or treatment significantly limited by social determinants of health.       Final diagnoses:  History of diabetes mellitus, type II    ED Discharge Orders     None  Danile Trier A, PA-C 04/01/24 2222    Mannie Pac T, DO 04/01/24 2250  "

## 2024-04-01 NOTE — ED Triage Notes (Signed)
 Pt arrives with [facility] from [facility]. Pt states changes to Glipizide dose about 1-1.5 months ago. CBG at [facility] in 300s. [facility] checked CBG found to be 265. Pt denies any symptoms.
